# Patient Record
Sex: Female | Born: 1980 | Race: White | Hispanic: No | Marital: Married | Smoking: Current every day smoker
Health system: Southern US, Community
[De-identification: ages and names within clinical notes are randomized; demographics above are authoritative.]

## PROBLEM LIST (undated history)

## (undated) DIAGNOSIS — F1910 Other psychoactive substance abuse, uncomplicated: Secondary | ICD-10-CM

## (undated) DIAGNOSIS — K219 Gastro-esophageal reflux disease without esophagitis: Secondary | ICD-10-CM

## (undated) HISTORY — PX: HAND SURGERY: SHX662

---
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
## 2002-06-13 ENCOUNTER — Emergency Department (HOSPITAL_COMMUNITY): Admission: EM | Admit: 2002-06-13 | Discharge: 2002-06-13 | Payer: Self-pay | Admitting: Emergency Medicine

## 2006-01-27 ENCOUNTER — Encounter (INDEPENDENT_AMBULATORY_CARE_PROVIDER_SITE_OTHER): Payer: Self-pay | Admitting: Specialist

## 2006-01-27 ENCOUNTER — Ambulatory Visit: Payer: Self-pay | Admitting: Gynecology

## 2006-03-11 ENCOUNTER — Other Ambulatory Visit: Admission: RE | Admit: 2006-03-11 | Discharge: 2006-03-11 | Payer: Self-pay | Admitting: Obstetrics and Gynecology

## 2006-03-11 ENCOUNTER — Encounter (INDEPENDENT_AMBULATORY_CARE_PROVIDER_SITE_OTHER): Payer: Self-pay | Admitting: Specialist

## 2006-03-11 ENCOUNTER — Ambulatory Visit: Payer: Self-pay | Admitting: Gynecology

## 2006-03-24 ENCOUNTER — Ambulatory Visit: Payer: Self-pay | Admitting: Gynecology

## 2007-03-18 ENCOUNTER — Emergency Department (HOSPITAL_COMMUNITY): Admission: EM | Admit: 2007-03-18 | Discharge: 2007-03-18 | Payer: Self-pay | Admitting: Emergency Medicine

## 2007-08-14 ENCOUNTER — Emergency Department (HOSPITAL_COMMUNITY): Admission: EM | Admit: 2007-08-14 | Discharge: 2007-08-14 | Payer: Self-pay | Admitting: Emergency Medicine

## 2007-09-26 ENCOUNTER — Emergency Department (HOSPITAL_COMMUNITY): Admission: EM | Admit: 2007-09-26 | Discharge: 2007-09-26 | Payer: Self-pay | Admitting: Emergency Medicine

## 2008-01-10 ENCOUNTER — Inpatient Hospital Stay (HOSPITAL_COMMUNITY): Admission: AD | Admit: 2008-01-10 | Discharge: 2008-01-22 | Payer: Self-pay | Admitting: Obstetrics and Gynecology

## 2010-08-21 NOTE — Group Therapy Note (Signed)
NAME:  [name] [name], [name] [name]                  ACCOUNT NO.:  [id number]   MEDICAL RECORD NO.:  [id number]          PATIENT TYPE:  WOC   LOCATION:  [facility]                   FACILITY:  [facility]   PHYSICIAN:  Ginger Carne, MD DATE OF BIRTH:  [birth year]   DATE OF SERVICE:                                  CLINIC NOTE   The patient returns today for followup on colposcopy results.  Colposcopy performed for ASCUS on the 7 of December 2007 revealed no  abnormalities.  An ECC was performed, pathology revealing benign tissue.   RECOMMENDATIONS:  Given the patient's history, I recommended one-year  followup.  Patient understands need for followup and her diagnoses.           ______________________________  Ginger Carne, MD     SHB/MEDQ  D:  03/24/2006  T:  03/25/2006  Job:  [id number]

## 2010-08-21 NOTE — Group Therapy Note (Signed)
NAME:  [name], [name]                  ACCOUNT NO.:  [id number]   MEDICAL RECORD NO.:  [id number]          PATIENT TYPE:  WOC   LOCATION:  [facility]                   FACILITY:  [facility]   PHYSICIAN:  Ginger Carne, MD DATE OF BIRTH:  February 04, 1981   DATE OF SERVICE:                                    CLINIC NOTE   This patient is a 30 year old nully gravida Caucasian female followed by her  primary care physician for Pap smear and pelvic exam only.  The patient  apparently was told by her primary care doctor that she needed to have a Pap  smear and pelvic exam before he would initiate oral contraceptives.  The  patient has no specific complaints.   SALIENT PHYSICAL FINDINGS:  EXTERNAL GENITALIA:  Vulva and vagina normal.  Cervix smooth without erosions or lesions.  Pap smear performed.  Uterus  small, anteverted and flexed and both adnexa negative.   PLAN:  The patient will return to her primary care physician for continued  management of contraception and primary care.           ______________________________  Ginger Carne, MD     SHB/MEDQ  D:  01/27/2006  T:  01/28/2006  Job:  [id number]

## 2010-08-21 NOTE — Discharge Summary (Signed)
NAME:  [name] [name]                  ACCOUNT NO.:  [id number]   MEDICAL RECORD NO.:  [id number]          PATIENT TYPE:  INP   LOCATION:  9306                          [facility]   PHYSICIAN:  Freddy Finner, M.D.   DATE OF BIRTH:  [birth year]   DATE OF ADMISSION:  01/10/2008  DATE OF DISCHARGE:  01/22/2008                               DISCHARGE SUMMARY   ADMITTING DIAGNOSES:  1. Intrauterine pregnancy at 96 and 3/7th weeks' estimated gestational      age.  2. Preterm labor with advanced cervical dilatation.   DISCHARGE DIAGNOSES:  1. Status post spontaneous vaginal delivery.  2. Viable female infant.  3. Chorioamnionitis, resolving.   PROCEDURES:  Spontaneous vaginal delivery.   REASON FOR ADMISSION:  Please see written H and P.   HOSPITAL COURSE:  The patient is a 30 year old nulligravida that was  admitted to [facility] at 10 and 3/7th weeks' estimated  gestational age for evaluation and treatment of preterm labor.  The  patient had been seen in the office where she was noted to be 2-cm  dilated 50% effaced.  The patient was now sent to the hospital for  further evaluation and ultrasound, which had revealed a fetus of  estimated fetal weight of 1151 g and normal amniotic fluid index.  Bulging membranes were noted on ultrasound and cervix was noted to be  dilated 2.2 cm with no appreciable cervix.  No contractions were seen on  the monitor.  Vital signs were stable.  She was afebrile.  Fetal heart  tones were in the 140s with acceleration.  The patient was now admitted  for tocolysis of her labor, IV antibiotics and betamethasone.  On the  following morning, the patient was without complaint.  She denied any  contractions, vaginal bleeding, loss of fluid.  Baby was active.  Vital  signs were stable.  She was afebrile.  Lungs, clear to auscultation.  Heart, regular rate and rhythm.  The patient continued on magnesium  sulfate and group B beta strep culture  had been performed.  Unasyn was  now discontinued due to no evidence of preterm labor.  On the following  morning, the patient continued to deny vaginal bleeding or loss of  fluid, pain or contractions.  Baby was active.  Contractions revealed 2-  4 contractions throughout the previous day.  Cervical exam was deferred.  Decision was made to discontinue magnesium sulfate.  She has now  received betamethasone for 2 administrations and she was continued on  total bedrest.  The patient was now started on Procardia and she  continued on bedrest.  The following morning, vital signs continued to  remain stable.  She was afebrile.  Baby was active.  No appreciable  contractions were noted.  Foley was now discontinued.  Over the next  several days, the patient did undergo ultrasound evaluation.  Continued  on continuous monitors.  Baby was now noted to be in the footling breech  presentation and cervix was noted to be 4 cm in dilatation by  ultrasound.  The patient continued  on complete bedrest with Procardia  administration.  The patient did one morning desired to go out to smoke,  but due to advanced cervical dilatation with a breech presentation, the  decision was made to keep the patient on strict bed rest and nicotine  patch was applied.  On the following morning, the patient was now 30 and  5/7th weeks.  She had noted some bloody show the previous evening.  She  denied any contractions.  Baby continued to be active.  Cervix was found  to be 3.5 cm, 80% with vertex presentation.  The patient continued on  p.o. Procardia, Prometrium intravaginally.  On the following morning,  the patient did complain of some mild bleeding.  She denied any  contractions.  Vital signs remained stable.  Uterine monitor revealed no  detectable contractions; however, later in that afternoon, the patient  did experience some pressure and increase in bleeding.  Dr. Vincente Poli was  contacted at that point.  No contractions  were palpable or stain on the  uterine monitor.  Cervical exam revealed the cervix was now 8-9 cm  completely effaced with vertex presentation.  Ultrasound was performed  to confirm vertex presentation, and there was some concern for possible  abruption.  The patient was now sent to Labor and Delivery for delivery  and NICU was contacted for delivery.  After taking the patient to labor  and delivery area, artificial rupture of membranes was performed, which  revealed clear fluid.  The patient did feel warm and concern was that  the patient was developing chorioamnionitis.  Contractions were now  detectable on the tachometer reading approximately every 2 minutes.  Fetal scalp electrode was applied, but was not working well.  The  patient was given additional dose of Unasyn.  Discussion was made with  the patient and her mother and all questions were answered.  The patient  now progressed to complete dilatation with vertex at a +2 station.  She  pushed less than 5 times and subsequently delivered a viable female  infant over intact perineum with Apgars of 8 at one minute and 9 at five  minutes.  NICU was present at the delivery.  Placenta was delivered  intact with 3-vessel cord and was sent to pathology.  No lacerations  were detected and estimated blood loss was 300 mL.  On the following  morning, the patient was doing well.  Baby was in the NICU, stable.  Vital signs were stable.  She was afebrile.  Abdomen, soft.  Fundus,  firm, nontender.  Moderate amount of lochia was noted.  On postpartum  day 2, the patient was without complaint.  Baby continued to be stable  in the NICU.  She was afebrile.  Fundus was firm and nontender.  Perineum was intact.  The patient was later discharged home.   CONDITION ON DISCHARGE:  Stable.   DIET:  As tolerated.   ACTIVITY:  Up as desired.  Restriction of no vaginal entry tissues,  tampons, intercourse, or tub baths.   DISCHARGE MEDICATIONS:  1.  Prenatal vitamins 1 p.o. daily.  2. Darvocet-N 100 numbers 30 one p.o. every 4 hours as needed for      pain.      Julio Sicks, N.P.      Freddy Finner, M.D.     CC/MEDQ  D:  02/09/2008  T:  02/10/2008  Job:  [id number]

## 2010-09-29 ENCOUNTER — Emergency Department (HOSPITAL_COMMUNITY): Payer: Self-pay

## 2010-09-29 ENCOUNTER — Emergency Department (HOSPITAL_COMMUNITY)
Admission: EM | Admit: 2010-09-29 | Discharge: 2010-09-29 | Disposition: A | Discharge status: Home / Self Care | Payer: Self-pay | Attending: Emergency Medicine | Admitting: Emergency Medicine

## 2010-09-29 DIAGNOSIS — W208XXA Other cause of strike by thrown, projected or falling object, initial encounter: Secondary | ICD-10-CM | POA: Insufficient documentation

## 2010-09-29 DIAGNOSIS — M79609 Pain in unspecified limb: Secondary | ICD-10-CM | POA: Insufficient documentation

## 2010-09-29 DIAGNOSIS — M7989 Other specified soft tissue disorders: Secondary | ICD-10-CM | POA: Insufficient documentation

## 2010-09-29 DIAGNOSIS — S92309A Fracture of unspecified metatarsal bone(s), unspecified foot, initial encounter for closed fracture: Secondary | ICD-10-CM | POA: Insufficient documentation

## 2011-01-04 LAB — CBC
HCT: 31.9 — ABNORMAL LOW
HCT: 36.2
Hemoglobin: 12.2
MCHC: 33.6
MCHC: 33.7
MCV: 93.2
MCV: 93.9
Platelets: 214
RDW: 13.6
WBC: 18.5 — ABNORMAL HIGH

## 2011-01-04 LAB — STREP B DNA PROBE: Strep Group B Ag: NEGATIVE

## 2011-01-04 LAB — RPR: RPR Ser Ql: NONREACTIVE

## 2011-01-11 LAB — URINALYSIS, ROUTINE W REFLEX MICROSCOPIC
Hgb urine dipstick: NEGATIVE
Specific Gravity, Urine: 1.023
Urobilinogen, UA: 0.2
pH: 7

## 2011-01-11 LAB — POCT PREGNANCY, URINE: Preg Test, Ur: NEGATIVE

## 2011-08-19 ENCOUNTER — Encounter (HOSPITAL_COMMUNITY): Payer: Self-pay | Admitting: Emergency Medicine

## 2011-08-19 ENCOUNTER — Emergency Department (HOSPITAL_COMMUNITY)
Admission: EM | Admit: 2011-08-19 | Discharge: 2011-08-20 | Disposition: A | Discharge status: Home / Self Care | Payer: Self-pay | Attending: Orthopedic Surgery | Admitting: Orthopedic Surgery

## 2011-08-19 DIAGNOSIS — L03119 Cellulitis of unspecified part of limb: Secondary | ICD-10-CM | POA: Insufficient documentation

## 2011-08-19 DIAGNOSIS — L02519 Cutaneous abscess of unspecified hand: Secondary | ICD-10-CM | POA: Insufficient documentation

## 2011-08-19 DIAGNOSIS — M659 Unspecified synovitis and tenosynovitis, unspecified site: Secondary | ICD-10-CM

## 2011-08-19 NOTE — ED Notes (Signed)
PT. REPORTS RIGHT WRIST PAIN WITH SWELLING ONSET YESTERDAY , STATES HER CAT SCRATCH HER.

## 2011-08-20 ENCOUNTER — Emergency Department (HOSPITAL_COMMUNITY): Payer: Self-pay

## 2011-08-20 ENCOUNTER — Encounter (HOSPITAL_COMMUNITY)
Admission: EM | Disposition: A | Discharge status: Home / Self Care | Payer: Self-pay | Source: Home / Self Care | Attending: Emergency Medicine

## 2011-08-20 ENCOUNTER — Encounter (HOSPITAL_COMMUNITY): Payer: Self-pay | Admitting: Certified Registered"

## 2011-08-20 ENCOUNTER — Encounter (HOSPITAL_COMMUNITY): Payer: Self-pay | Admitting: Orthopedic Surgery

## 2011-08-20 ENCOUNTER — Emergency Department (HOSPITAL_COMMUNITY): Payer: Self-pay | Admitting: Certified Registered"

## 2011-08-20 HISTORY — PX: I & D EXTREMITY: SHX5045

## 2011-08-20 LAB — CBC
MCHC: 33.8 g/dL (ref 30.0–36.0)
MCV: 90.9 fL (ref 78.0–100.0)
RBC: 4.4 MIL/uL (ref 3.87–5.11)
RDW: 13.6 % (ref 11.5–15.5)

## 2011-08-20 LAB — DIFFERENTIAL
Basophils Relative: 0 % (ref 0–1)
Eosinophils Relative: 1 % (ref 0–5)
Lymphs Abs: 1.9 10*3/uL (ref 0.7–4.0)
Monocytes Absolute: 0.5 10*3/uL (ref 0.1–1.0)
Monocytes Relative: 4 % (ref 3–12)
Neutro Abs: 8.8 10*3/uL — ABNORMAL HIGH (ref 1.7–7.7)
Neutrophils Relative %: 78 % — ABNORMAL HIGH (ref 43–77)

## 2011-08-20 LAB — BASIC METABOLIC PANEL
CO2: 23 mEq/L (ref 19–32)
Calcium: 9.2 mg/dL (ref 8.4–10.5)
Creatinine, Ser: 0.8 mg/dL (ref 0.50–1.10)
GFR calc Af Amer: >90 mL/min (ref >90)
Potassium: 4.2 mEq/L (ref 3.5–5.1)
Sodium: 135 mEq/L (ref 135–145)

## 2011-08-20 SURGERY — IRRIGATION AND DEBRIDEMENT EXTREMITY
Anesthesia: General | Site: Wrist | Laterality: Right | Wound class: Dirty or Infected

## 2011-08-20 MED ORDER — MIDAZOLAM HCL 5 MG/5ML IJ SOLN
INTRAMUSCULAR | Status: DC | PRN
  Administered 2011-08-20: 2 mg via INTRAVENOUS

## 2011-08-20 MED ORDER — AMPICILLIN-SULBACTAM SODIUM 3 (2-1) G IJ SOLR
3.0000 g | Freq: Once | INTRAMUSCULAR | Status: AC
  Administered 2011-08-20: 3 g via INTRAVENOUS
  Filled 2011-08-20: qty 3

## 2011-08-20 MED ORDER — LACTATED RINGERS IV SOLN
INTRAVENOUS | Status: DC | PRN
  Administered 2011-08-20: 02:00:00 via INTRAVENOUS

## 2011-08-20 MED ORDER — HYDROMORPHONE HCL PF 1 MG/ML IJ SOLN: 0.2500 mg | INTRAMUSCULAR | Status: DC | PRN

## 2011-08-20 MED ORDER — OXYCODONE-ACETAMINOPHEN 5-325 MG PO TABS: ORAL_TABLET | ORAL | Status: AC

## 2011-08-20 MED ORDER — BUPIVACAINE HCL (PF) 0.25 % IJ SOLN
INTRAMUSCULAR | Status: DC | PRN
  Administered 2011-08-20: 10 mL

## 2011-08-20 MED ORDER — AMOXICILLIN-POT CLAVULANATE 875-125 MG PO TABS: 1.0000 | ORAL_TABLET | Freq: Two times a day (BID) | ORAL | Status: AC

## 2011-08-20 MED ORDER — ONDANSETRON HCL 4 MG/2ML IJ SOLN
4.0000 mg | Freq: Once | INTRAMUSCULAR | Status: AC
  Administered 2011-08-20: 4 mg via INTRAVENOUS
  Filled 2011-08-20: qty 2

## 2011-08-20 MED ORDER — LORAZEPAM 2 MG/ML IJ SOLN: 1.0000 mg | Freq: Once | INTRAMUSCULAR | Status: AC | PRN

## 2011-08-20 MED ORDER — MORPHINE SULFATE 4 MG/ML IJ SOLN
4.0000 mg | Freq: Once | INTRAMUSCULAR | Status: AC
  Administered 2011-08-20: 4 mg via INTRAVENOUS
  Filled 2011-08-20: qty 1

## 2011-08-20 MED ORDER — PROPOFOL 10 MG/ML IV EMUL
INTRAVENOUS | Status: DC | PRN
  Administered 2011-08-20: 160 mg via INTRAVENOUS

## 2011-08-20 MED ORDER — SODIUM CHLORIDE 0.9 % IR SOLN
Status: DC | PRN
  Administered 2011-08-20: 3000 mL

## 2011-08-20 MED ORDER — LIDOCAINE HCL (CARDIAC) 20 MG/ML IV SOLN
INTRAVENOUS | Status: DC | PRN
  Administered 2011-08-20: 100 mg via INTRAVENOUS

## 2011-08-20 MED ORDER — PROMETHAZINE HCL 25 MG/ML IJ SOLN: 6.2500 mg | INTRAMUSCULAR | Status: DC | PRN

## 2011-08-20 MED ORDER — FENTANYL CITRATE 0.05 MG/ML IJ SOLN
INTRAMUSCULAR | Status: DC | PRN
  Administered 2011-08-20: 100 ug via INTRAVENOUS

## 2011-08-20 SURGICAL SUPPLY — 53 items
BANDAGE COBAN STERILE 2 (GAUZE/BANDAGES/DRESSINGS) IMPLANT
BANDAGE CONFORM 2  STR LF (GAUZE/BANDAGES/DRESSINGS) IMPLANT
BANDAGE ELASTIC 3 VELCRO ST LF (GAUZE/BANDAGES/DRESSINGS) ×2 IMPLANT
BANDAGE ELASTIC 4 VELCRO ST LF (GAUZE/BANDAGES/DRESSINGS) ×1 IMPLANT
BANDAGE GAUZE ELAST BULKY 4 IN (GAUZE/BANDAGES/DRESSINGS) ×1 IMPLANT
BNDG CMPR 9X4 STRL LF SNTH (GAUZE/BANDAGES/DRESSINGS)
BNDG COHESIVE 1X5 TAN STRL LF (GAUZE/BANDAGES/DRESSINGS) IMPLANT
BNDG ESMARK 4X9 LF (GAUZE/BANDAGES/DRESSINGS) IMPLANT
CLOTH BEACON ORANGE TIMEOUT ST (SAFETY) ×2 IMPLANT
CORDS BIPOLAR (ELECTRODE) ×2 IMPLANT
COVER SURGICAL LIGHT HANDLE (MISCELLANEOUS) ×2 IMPLANT
DECANTER SPIKE VIAL GLASS SM (MISCELLANEOUS) ×2 IMPLANT
DRAIN PENROSE 1/4X12 LTX STRL (WOUND CARE) IMPLANT
DRSG ADAPTIC 3X8 NADH LF (GAUZE/BANDAGES/DRESSINGS) IMPLANT
DRSG EMULSION OIL 3X3 NADH (GAUZE/BANDAGES/DRESSINGS) ×1 IMPLANT
DRSG PAD ABDOMINAL 8X10 ST (GAUZE/BANDAGES/DRESSINGS) ×2 IMPLANT
GAUZE PACKING IODOFORM 1 (PACKING) ×1 IMPLANT
GAUZE XEROFORM 1X8 LF (GAUZE/BANDAGES/DRESSINGS) ×1 IMPLANT
GLOVE BIO SURGEON STRL SZ7.5 (GLOVE) ×2 IMPLANT
GLOVE BIOGEL PI IND STRL 8 (GLOVE) ×1 IMPLANT
GLOVE BIOGEL PI INDICATOR 8 (GLOVE) ×1
GOWN STRL REIN XL XLG (GOWN DISPOSABLE) ×1 IMPLANT
HANDPIECE INTERPULSE COAX TIP (DISPOSABLE)
KIT BASIN OR (CUSTOM PROCEDURE TRAY) ×2 IMPLANT
KIT ROOM TURNOVER OR (KITS) ×2 IMPLANT
LOOP VESSEL MAXI BLUE (MISCELLANEOUS) IMPLANT
LOOP VESSEL MINI RED (MISCELLANEOUS) IMPLANT
MANIFOLD NEPTUNE II (INSTRUMENTS) ×2 IMPLANT
NDL HYPO 25X1 1.5 SAFETY (NEEDLE) IMPLANT
NEEDLE HYPO 25X1 1.5 SAFETY (NEEDLE) ×2 IMPLANT
NS IRRIG 1000ML POUR BTL (IV SOLUTION) ×2 IMPLANT
PACK ORTHO EXTREMITY (CUSTOM PROCEDURE TRAY) ×2 IMPLANT
PAD ARMBOARD 7.5X6 YLW CONV (MISCELLANEOUS) ×4 IMPLANT
PADDING CAST ABS 4INX4YD NS (CAST SUPPLIES) ×1
PADDING CAST ABS COTTON 4X4 ST (CAST SUPPLIES) IMPLANT
SCRUB BETADINE 4OZ XXX (MISCELLANEOUS) ×2 IMPLANT
SET HNDPC FAN SPRY TIP SCT (DISPOSABLE) IMPLANT
SOLUTION BETADINE 4OZ (MISCELLANEOUS) ×2 IMPLANT
SPONGE GAUZE 4X4 12PLY (GAUZE/BANDAGES/DRESSINGS) ×1 IMPLANT
SPONGE LAP 18X18 X RAY DECT (DISPOSABLE) ×1 IMPLANT
SPONGE LAP 4X18 X RAY DECT (DISPOSABLE) ×1 IMPLANT
SUCTION FRAZIER TIP 10 FR DISP (SUCTIONS) ×2 IMPLANT
SUT ETHILON 4 0 PS 2 18 (SUTURE) ×2 IMPLANT
SUT MON AB 5-0 P3 18 (SUTURE) IMPLANT
SYR CONTROL 10ML LL (SYRINGE) ×1 IMPLANT
TOWEL OR 17X24 6PK STRL BLUE (TOWEL DISPOSABLE) ×2 IMPLANT
TOWEL OR 17X26 10 PK STRL BLUE (TOWEL DISPOSABLE) ×2 IMPLANT
TUBE ANAEROBIC SPECIMEN COL (MISCELLANEOUS) ×1 IMPLANT
TUBE CONNECTING 12X1/4 (SUCTIONS) ×2 IMPLANT
TUBE FEEDING 5FR 15 INCH (TUBING) IMPLANT
UNDERPAD 30X30 INCONTINENT (UNDERPADS AND DIAPERS) ×2 IMPLANT
WATER STERILE IRR 1000ML POUR (IV SOLUTION) ×2 IMPLANT
YANKAUER SUCT BULB TIP NO VENT (SUCTIONS) ×2 IMPLANT

## 2011-08-20 NOTE — ED Provider Notes (Signed)
History     CSN: [id number]  Arrival date & time 08/19/11  2343   First MD Initiated Contact with Patient 08/20/11 0025      Chief Complaint  Patient presents with  . Wrist Pain    (Consider location/radiation/quality/duration/timing/severity/associated sxs/prior treatment) HPI Comments: Patient here with right wrist pain s/p cat scratch 2 days ago - states began to notice increase in swelling earlier in the day today, states pain with movement of fingers and wrist - states intially just a hematoma to the dorsum of the hand with a couple of superficial scratches - last ate at 6:30pm - reports fever started this evening at 101 and she took ibuprofen for this - denies numbness or tingling distal to the area.  Patient is a 31 y.o. female presenting with wrist pain. The history is provided by the patient. No language interpreter was used.  Wrist Pain This is a new problem. The current episode started in the past 7 days. The problem occurs constantly. The problem has been unchanged. Associated symptoms include arthralgias, a fever, joint swelling and myalgias. Pertinent negatives include no abdominal pain, anorexia, change in bowel habit, chest pain, chills, congestion, coughing, diaphoresis, fatigue, headaches, nausea, neck pain, numbness, rash, sore throat, swollen glands, urinary symptoms, vertigo, visual change, vomiting or weakness. The symptoms are aggravated by nothing. She has tried nothing for the symptoms. The treatment provided no relief.    History reviewed. No pertinent past medical history.  History reviewed. No pertinent past surgical history.  No family history on file.  History  Substance Use Topics  . Smoking status: Not on file  . Smokeless tobacco: Not on file  . Alcohol Use: Not on file    OB History    Grav Para Term Preterm Abortions TAB SAB Ect Mult Living                  Review of Systems  Constitutional: Positive for fever. Negative for chills,  diaphoresis and fatigue.  HENT: Negative for congestion, sore throat and neck pain.   Respiratory: Negative for cough.   Cardiovascular: Negative for chest pain.  Gastrointestinal: Negative for nausea, vomiting, abdominal pain, anorexia and change in bowel habit.  Musculoskeletal: Positive for myalgias, joint swelling and arthralgias.  Skin: Negative for rash.  Neurological: Negative for vertigo, weakness, numbness and headaches.  All other systems reviewed and are negative.    Allergies  Review of patient's allergies indicates no known allergies.  Home Medications   Current Outpatient Rx  Name Route Sig Dispense Refill  . IBUPROFEN 200 MG PO TABS Oral Take 400 mg by mouth every 6 (six) hours as needed. For pain      BP 140/70  Pulse 97  Temp(Src) 98.5 F (36.9 C) (Oral)  Resp 16  SpO2 97%  Physical Exam  Nursing note and vitals reviewed. Constitutional: She is oriented to person, place, and time. She appears well-developed and well-nourished. No distress.  HENT:  Head: Normocephalic and atraumatic.  Right Ear: External ear normal.  Left Ear: External ear normal.  Nose: Nose normal.  Mouth/Throat: Oropharynx is clear and moist. No oropharyngeal exudate.  Eyes: Conjunctivae are normal. Pupils are equal, round, and reactive to light. No scleral icterus.  Neck: Normal range of motion. Neck supple.  Cardiovascular: Normal rate, regular rhythm and normal heart sounds.  Exam reveals no gallop and no friction rub.   No murmur heard. Pulmonary/Chest: Effort normal and breath sounds normal. No respiratory distress. She has no  wheezes. She has no rales. She exhibits no tenderness.  Abdominal: Soft. Bowel sounds are normal. She exhibits no distension. There is no tenderness.  Musculoskeletal:       Right hand: She exhibits decreased range of motion, tenderness and swelling. She exhibits no bony tenderness and normal capillary refill. normal sensation noted. Normal strength noted.        Hands:      Hematoma noted at the base of the 3rd and 4th MC with erythema and swelling more proximal to this - mild erythematous streaking up the forearm.  Lymphadenopathy:    She has no cervical adenopathy.  Neurological: She is alert and oriented to person, place, and time. No cranial nerve deficit.  Skin: Skin is warm and dry. No rash noted. There is erythema. No pallor.  Psychiatric: She has a normal mood and affect. Her behavior is normal. Judgment and thought content normal.    ED Course  Procedures (including critical care time)   Labs Reviewed  CBC  DIFFERENTIAL   No results found. Results for orders placed during the hospital encounter of 08/19/11  CBC      Component Value Range   WBC 11.2 (*) 4.0 - 10.5 (K/uL)   RBC 4.40  3.87 - 5.11 (MIL/uL)   Hemoglobin 13.5  12.0 - 15.0 (g/dL)   HCT 09.8  11.9 - 14.7 (%)   MCV 90.9  78.0 - 100.0 (fL)   MCH 30.7  26.0 - 34.0 (pg)   MCHC 33.8  30.0 - 36.0 (g/dL)   RDW 82.9  56.2 - 13.0 (%)   Platelets 171  150 - 400 (K/uL)  DIFFERENTIAL      Component Value Range   Neutrophils Relative 78 (*) 43 - 77 (%)   Neutro Abs 8.8 (*) 1.7 - 7.7 (K/uL)   Lymphocytes Relative 17  12 - 46 (%)   Lymphs Abs 1.9  0.7 - 4.0 (K/uL)   Monocytes Relative 4  3 - 12 (%)   Monocytes Absolute 0.5  0.1 - 1.0 (K/uL)   Eosinophils Relative 1  0 - 5 (%)   Eosinophils Absolute 0.1  0.0 - 0.7 (K/uL)   Basophils Relative 0  0 - 1 (%)   Basophils Absolute 0.0  0.0 - 0.1 (K/uL)   Dg Wrist Complete Right  08/20/2011  *RADIOLOGY REPORT*  Clinical Data: Right wrist pain and swelling.  Cat scratch.  RIGHT WRIST - COMPLETE 3+ VIEW  Comparison: None.  Findings: No fracture, foreign body, or acute bony findings are identified.  No gas is identified within the soft tissues.  IMPRESSION:  1.  No significant abnormality identified.  Original Report Authenticated By: Dellia Cloud, M.D.      Tenosynovitis    MDM  Patient with infection from cat  scratch c/w tenosynovitis - spoke with Dr. Merlyn Lot who will see the patient here and take the patient to the OR for I&D of the area.  Unasyn 3gm sent to the OR with the patient.        Izola Price [place], [place] 08/20/11 [phone]

## 2011-08-20 NOTE — ED Notes (Signed)
Pt getting undressed and ready for OR

## 2011-08-20 NOTE — Preoperative (Signed)
Beta Blockers   Reason not to administer Beta Blockers:Not Applicable 

## 2011-08-20 NOTE — Transfer of Care (Signed)
Immediate Anesthesia Transfer of Care Note  Patient: [name] [name]  Procedure(s) Performed: Procedure(s) (LRB): IRRIGATION AND DEBRIDEMENT EXTREMITY (Right)  Patient Location: PACU  Anesthesia Type: General  Level of Consciousness: awake, alert  and oriented  Airway & Oxygen Therapy: Patient Spontanous Breathing and Patient connected to nasal cannula oxygen  Post-op Assessment: Report given to PACU RN, Post -op Vital signs reviewed and stable and Patient moving all extremities  Post vital signs: Reviewed and stable  Complications: No apparent anesthesia complications

## 2011-08-20 NOTE — ED Provider Notes (Signed)
Medical screening examination/treatment/procedure(s) were performed by non-physician practitioner and as supervising physician I was immediately available for consultation/collaboration.  Arvie Bartholomew M Giordano Getman, MD 08/20/11 0649 

## 2011-08-20 NOTE — Op Note (Signed)
Dictation [phone]

## 2011-08-20 NOTE — Anesthesia Postprocedure Evaluation (Signed)
  Anesthesia Post-op Note  Patient: [name] [name]  Procedure(s) Performed: Procedure(s) (LRB): IRRIGATION AND DEBRIDEMENT EXTREMITY (Right)  Patient Location: PACU  Anesthesia Type: General  Level of Consciousness: awake  Airway and Oxygen Therapy: Patient Spontanous Breathing  Post-op Pain: mild  Post-op Assessment: Post-op Vital signs reviewed, Patient's Cardiovascular Status Stable, Respiratory Function Stable, Patent Airway, No signs of Nausea or vomiting and Pain level controlled  Post-op Vital Signs: stable  Complications: No apparent anesthesia complications

## 2011-08-20 NOTE — Discharge Instructions (Signed)

## 2011-08-20 NOTE — Op Note (Signed)
[name], [name]                ACCOUNT NO.:  [id number]  MEDICAL RECORD NO.:  [id number]  LOCATION:  [facility]                         FACILITY:  [facility]  PHYSICIAN:  Betha Loa, MD        DATE OF BIRTH:  02-18-1981  DATE OF PROCEDURE:  08/20/2011 DATE OF DISCHARGE:                              OPERATIVE REPORT   PREOPERATIVE DIAGNOSIS:  Right dorsal wrist abscess.  POSTOPERATIVE DIAGNOSIS:  Right dorsal wrist abscess and fourth dorsal compartment infection.  PROCEDURE:  Irrigation and debridement of right dorsal wrist abscess and fourth dorsal compartment.  SURGEON:  Betha Loa, M.D.  ASSISTANT:  None.  ANESTHESIA:  General.  IV FLUIDS:  Per anesthesia flow sheet.  ESTIMATED BLOOD LOSS:  Minimal.  COMPLICATIONS:  None.  SPECIMENS:  Cultures to Micro.  DISPOSITION:  Stable to PACU.  TOURNIQUET TIME:  30 minutes.  INDICATIONS:  [name] [name] is a 31 year old right-hand-dominant female who states a day and half ago, her kitten scratched her on the dorsum of the right hand.  She has progressively had more swelling, pain, and erythema in the dorsum of the hand and wrist.  She has pain with motion of her fingers.  She presented to [facility].  She was consulted for management of this problem.  On examination, she had intact sensation and capillary refill in all fingertips.  She can flex and extend the IP joint of the thumb across her fingers.  She had pain with motion of her fingers at the dorsal aspect of the wrist.  There was a fluctuant area here.  There was erythema surrounding it.  There were small scratch marks on the dorsum of the hand.  I recommended [name] [name] going to the operating room for irrigation and debridement of the abscess.  Risks, benefits, alternatives of surgery were discussed including the risk of blood loss; infection; damage to nerves, vessels, tendons, ligaments, bone; failure of surgery; need for additional surgery;  complications with wound healing, continued pain, continued infection; need for repeat irrigation and debridement.  She voiced understanding of these risks and elected to proceed.  OPERATIVE COURSE:  After being identified preoperatively by myself, the patient and I agreed upon the procedure and site of procedure.  The surgical site was marked.  The risks, benefits, and alternatives of surgery were reviewed and she wished to proceed.  Surgical consent had been signed.  Antibiotics were held for cultures.  She was transferred to the operating room and placed on the operating table in supine position with the right upper extremity on arm board.  General anesthesia was induced by the anesthesiologist.  The right upper extremity was prepped and draped in normal sterile orthopedic fashion. A surgical pause was performed between the surgeons, anesthesia, and operating room staff, and all were in agreement as to the patient, procedure, and site of procedure.  The tourniquet at the proximal aspect of the extremity was inflated to 250 mmHg after gravity exsanguination of the hand and wrist and Esmarch exsanguination of the forearm.  An incision was made directly over the dorsum of the wrist.  This carried into subcutaneous tissues by spreading technique.  There was  significant tenosynovium visible in the fourth dorsal compartment.  Once this was entered, there was significant amount of a thin cloudy fluid.  Cultures were taken for both aerobes and anaerobes, and sent to Microbiology for examination.  The wound was explored.  There was a significant amount of thickened and inflamed tenosynovium surrounding the EDC tendons.  This was distal to the actual compartment on the dorsum of the wrist.  The tenosynovium was debrided.  The joint capsule was visualized.  It was not bulging and did not appear full.  There was some dark friable tissue in the subcutaneous tissues at the distal aspect of the  wound.  This appeared that it may be a coagulated vein.  This was debrided as well. The wound was copiously irrigated with 3000 mL of sterile saline by cysto tubing.  The wound was then packed open with quarter-inch iodoform gauze.  A single 4-0 nylon suture was placed at the proximal aspect of the wound to decrease wound size.  The wound overall was left very open. It was injected with 10 mL of 0.25% plain Marcaine to aid in postoperative analgesia.  It was then dressed with sterile Xeroform, 4x4s, and wrapped with a Kerlix bandage.  A volar splint was placed and wrapped with Kerlix and Ace bandage.  Tourniquet was deflated at 30 minutes.  The operative drapes were broken down and the patient was awoken from anesthesia safely.  She was transferred back to stretcher and taken to PACU in stable condition.  I will give her Percocet 5/325, 1-2 p.o. q.6 hours p.r.n. pain, dispensed #40 and Augmentin 875 mg p.o. b.i.d. for 10 days.  She will be seen in the office on Monday to start hydrotherapy.     Betha Loa, MD     KK/MEDQ  D:  08/20/2011  T:  08/20/2011  Job:  [id number]

## 2011-08-20 NOTE — H&P (Signed)
[name] [name] is an 31 y.o. female.   Chief Complaint: right wrist pain HPI: 31 yo rhd female states she was scratched by her kitten yesterday.  Has developed pain, swelling, erythema, ecchymosis on dorsum of right hand/wrist.  Pain with motion of fingers.  Fever to 100 per patient.  Reports no other bite or scratch wounds at this time.  History reviewed. No pertinent past medical history.  History reviewed. No pertinent past surgical history.  No family history on file. Social History:  does not have a smoking history on file. She does not have any smokeless tobacco history on file. Her alcohol and drug histories not on file.  Allergies: No Known Allergies   (Not in a hospital admission)  Results for orders placed during the hospital encounter of 08/19/11 (from the past 48 hour(s))  CBC     Status: Abnormal   Collection Time   08/20/11 12:37 AM      Component Value Range Comment   WBC 11.2 (*) 4.0 - 10.5 (K/uL)    RBC 4.40  3.87 - 5.11 (MIL/uL)    Hemoglobin 13.5  12.0 - 15.0 (g/dL)    HCT 16.1  09.6 - 04.5 (%)    MCV 90.9  78.0 - 100.0 (fL)    MCH 30.7  26.0 - 34.0 (pg)    MCHC 33.8  30.0 - 36.0 (g/dL)    RDW 40.9  81.1 - 91.4 (%)    Platelets 171  150 - 400 (K/uL)   DIFFERENTIAL     Status: Abnormal   Collection Time   08/20/11 12:37 AM      Component Value Range Comment   Neutrophils Relative 78 (*) 43 - 77 (%)    Neutro Abs 8.8 (*) 1.7 - 7.7 (K/uL)    Lymphocytes Relative 17  12 - 46 (%)    Lymphs Abs 1.9  0.7 - 4.0 (K/uL)    Monocytes Relative 4  3 - 12 (%)    Monocytes Absolute 0.5  0.1 - 1.0 (K/uL)    Eosinophils Relative 1  0 - 5 (%)    Eosinophils Absolute 0.1  0.0 - 0.7 (K/uL)    Basophils Relative 0  0 - 1 (%)    Basophils Absolute 0.0  0.0 - 0.1 (K/uL)     Dg Wrist Complete Right  08/20/2011  *RADIOLOGY REPORT*  Clinical Data: Right wrist pain and swelling.  Cat scratch.  RIGHT WRIST - COMPLETE 3+ VIEW  Comparison: None.  Findings: No fracture, foreign  body, or acute bony findings are identified.  No gas is identified within the soft tissues.  IMPRESSION:  1.  No significant abnormality identified.  Original Report Authenticated By: Dellia Cloud, M.D.     A comprehensive review of systems was negative except for: Constitutional: positive for chills and fevers  Blood pressure 140/70, pulse 97, temperature 98.5 F (36.9 C), temperature source Oral, resp. rate 16, SpO2 97.00%.  General appearance: alert, cooperative and appears stated age Head: Normocephalic, without obvious abnormality, atraumatic Neck: supple, symmetrical, trachea midline Resp: clear to auscultation bilaterally Cardio: regular rate and rhythm GI: soft, non-tender; bowel sounds normal; no masses,  no organomegaly Extremities: light touch sensation and capillary refill intact all digits.  +epl/fpl/io.  left UE: no wounds or ttp.  right UE: scratch marks and pinpoint marks on dorsum of right hand.  ecchymosis dorsum of hand.  fluctuant area over wrist that is tender to palpation.  pain at this area with motion  of digits and wrist.  no tenderness at elbow or in digits.   no lymphangitis noted. Pulses: 2+ and symmetric Skin: as above Neurologic: Grossly normal Incision/Wound: As above  Assessment/Plan Right wrist/hand abscess.  Recommend OR for I&D of abscess.  Risks, benefits, and alternatives of surgery were discussed and the patient agrees with the plan of care.   Karon Cotterill R 08/20/2011, 1:31 AM

## 2011-08-20 NOTE — Anesthesia Preprocedure Evaluation (Addendum)
Anesthesia Evaluation  Patient identified by MRN, date of birth, ID band Patient awake    Reviewed: Allergy & Precautions, H&P , NPO status , Patient's Chart, lab work & pertinent test results  Airway Mallampati: I TM Distance: >3 FB Neck ROM: Full    Dental  (+) Teeth Intact   Pulmonary Current Smoker,    Pulmonary exam normal       Cardiovascular     Neuro/Psych    GI/Hepatic   Endo/Other    Renal/GU      Musculoskeletal   Abdominal   Peds  Hematology   Anesthesia Other Findings   Reproductive/Obstetrics                          Anesthesia Physical Anesthesia Plan  ASA: II and Emergent  Anesthesia Plan: General   Post-op Pain Management:    Induction: Intravenous  Airway Management Planned: LMA  Additional Equipment:   Intra-op Plan:   Post-operative Plan: Extubation in OR  Informed Consent: I have reviewed the patients History and Physical, chart, labs and discussed the procedure including the risks, benefits and alternatives for the proposed anesthesia with the patient or authorized representative who has indicated his/her understanding and acceptance.     Plan Discussed with: CRNA and Surgeon  Anesthesia Plan Comments:         Anesthesia Quick Evaluation

## 2011-08-20 NOTE — ED Notes (Signed)
Hand surgery at bedside.

## 2011-08-20 NOTE — Anesthesia Procedure Notes (Signed)
Procedure Name: LMA Insertion Date/Time: 08/20/2011 2:11 AM Performed by: Rossie Muskrat L Pre-anesthesia Checklist: Patient identified, Patient being monitored, Timeout performed, Emergency Drugs available and Suction available Patient Re-evaluated:Patient Re-evaluated prior to inductionPreoxygenation: Pre-oxygenation with 100% oxygen Intubation Type: IV induction LMA: LMA inserted LMA Size: 4.0 Number of attempts: 1 Placement Confirmation: positive ETCO2 and breath sounds checked- equal and bilateral Tube secured with: Tape Dental Injury: Teeth and Oropharynx as per pre-operative assessment

## 2011-08-23 ENCOUNTER — Encounter (HOSPITAL_COMMUNITY): Payer: Self-pay | Admitting: Orthopedic Surgery

## 2011-08-23 LAB — CULTURE, ROUTINE-ABSCESS: Culture: NO GROWTH

## 2011-08-25 LAB — ANAEROBIC CULTURE

## 2011-11-01 ENCOUNTER — Emergency Department (HOSPITAL_COMMUNITY)
Admission: EM | Admit: 2011-11-01 | Discharge: 2011-11-01 | Disposition: A | Discharge status: Home / Self Care | Payer: Self-pay | Attending: Emergency Medicine | Admitting: Emergency Medicine

## 2011-11-01 ENCOUNTER — Emergency Department (HOSPITAL_COMMUNITY): Payer: Self-pay

## 2011-11-01 ENCOUNTER — Encounter (HOSPITAL_COMMUNITY): Payer: Self-pay | Admitting: *Deleted

## 2011-11-01 DIAGNOSIS — M25539 Pain in unspecified wrist: Secondary | ICD-10-CM | POA: Insufficient documentation

## 2011-11-01 DIAGNOSIS — F17200 Nicotine dependence, unspecified, uncomplicated: Secondary | ICD-10-CM | POA: Insufficient documentation

## 2011-11-01 MED ORDER — NAPROXEN 500 MG PO TABS: 500.0000 mg | ORAL_TABLET | Freq: Two times a day (BID) | ORAL | Status: DC

## 2011-11-01 MED ORDER — HYDROCODONE-ACETAMINOPHEN 5-325 MG PO TABS: 1.0000 | ORAL_TABLET | Freq: Four times a day (QID) | ORAL | Status: DC | PRN

## 2011-11-01 MED ORDER — HYDROCODONE-ACETAMINOPHEN 5-325 MG PO TABS
1.0000 | ORAL_TABLET | Freq: Once | ORAL | Status: AC
  Administered 2011-11-01: 1 via ORAL
  Filled 2011-11-01: qty 1
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
## 2011-11-01 NOTE — Progress Notes (Signed)
Orthopedic Tech Progress Note Patient Details:  [name] [name] 28-Mar-1981 [id number]  Ortho Devices Type of Ortho Device: Velcro wrist splint Ortho Device/Splint Location: left UE Ortho Device/Splint Interventions: Application   Tyshan Enderle T 11/01/2011, 12:06 PM

## 2011-11-01 NOTE — ED Notes (Signed)
Pt reported to RN that she has a friend that has worked somewhere in the medical field and could give her something for her back pain. Pt was told that she had to come to the friends house to receive it. Pt received an injection of an unknown medication in left wrist. Pt reported that pain and inflammation started since then. MD notified

## 2011-11-01 NOTE — ED Notes (Signed)
Pt woke up with swelling to left hand/wrist, states pain goes from fingers to elbow. Pt states she is unable to wiggle pinky and ring finger. Pt with bruising to left hand, she states its from falling through glass a while back.

## 2011-11-03 ENCOUNTER — Emergency Department (HOSPITAL_BASED_OUTPATIENT_CLINIC_OR_DEPARTMENT_OTHER)
Admission: EM | Admit: 2011-11-03 | Discharge: 2011-11-03 | Disposition: A | Discharge status: Home / Self Care | Payer: Self-pay | Attending: Emergency Medicine | Admitting: Emergency Medicine

## 2011-11-03 ENCOUNTER — Encounter (HOSPITAL_BASED_OUTPATIENT_CLINIC_OR_DEPARTMENT_OTHER): Payer: Self-pay | Admitting: *Deleted

## 2011-11-03 DIAGNOSIS — L03119 Cellulitis of unspecified part of limb: Secondary | ICD-10-CM

## 2011-11-03 DIAGNOSIS — F17200 Nicotine dependence, unspecified, uncomplicated: Secondary | ICD-10-CM | POA: Insufficient documentation

## 2011-11-03 DIAGNOSIS — L02519 Cutaneous abscess of unspecified hand: Secondary | ICD-10-CM | POA: Insufficient documentation

## 2011-11-03 LAB — CBC WITH DIFFERENTIAL/PLATELET
Basophils Relative: 0 % (ref 0–1)
Eosinophils Absolute: 0.1 10*3/uL (ref 0.0–0.7)
MCH: 30.6 pg (ref 26.0–34.0)
MCHC: 34.1 g/dL (ref 30.0–36.0)
Monocytes Relative: 6 % (ref 3–12)
Neutrophils Relative %: 75 % (ref 43–77)
Platelets: 205 10*3/uL (ref 150–400)

## 2011-11-03 LAB — BASIC METABOLIC PANEL
BUN: 9 mg/dL (ref 6–23)
Calcium: 9.5 mg/dL (ref 8.4–10.5)
GFR calc Af Amer: >90 mL/min (ref >90)
GFR calc non Af Amer: >90 mL/min (ref >90)
Potassium: 3.9 mEq/L (ref 3.5–5.1)

## 2011-11-03 LAB — RAPID URINE DRUG SCREEN, HOSP PERFORMED: Benzodiazepines: NOT DETECTED

## 2011-11-03 MED ORDER — OXYCODONE-ACETAMINOPHEN 5-325 MG PO TABS: 2.0000 | ORAL_TABLET | ORAL | Status: DC | PRN

## 2011-11-03 MED ORDER — VANCOMYCIN HCL IN DEXTROSE 1-5 GM/200ML-% IV SOLN
1000.0000 mg | Freq: Once | INTRAVENOUS | Status: AC
  Administered 2011-11-03 – 2011-11-04 (×2): 1000 mg via INTRAVENOUS
  Filled 2011-11-03: qty 200

## 2011-11-03 MED ORDER — ONDANSETRON HCL 4 MG/2ML IJ SOLN
4.0000 mg | Freq: Once | INTRAMUSCULAR | Status: AC
  Administered 2011-11-03: 4 mg via INTRAVENOUS
  Filled 2011-11-03: qty 2

## 2011-11-03 MED ORDER — HYDROMORPHONE HCL PF 1 MG/ML IJ SOLN
1.0000 mg | Freq: Once | INTRAMUSCULAR | Status: AC
  Administered 2011-11-03: 1 mg via INTRAVENOUS
  Filled 2011-11-03: qty 1

## 2011-11-03 MED ORDER — SODIUM CHLORIDE 0.9 % IV SOLN
Freq: Once | INTRAVENOUS | Status: AC
  Administered 2011-11-03: 22:00:00 via INTRAVENOUS

## 2011-11-03 NOTE — ED Notes (Addendum)
Pt seen 7/29 at [facility] for same and received rx for vicodin and naproxen
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
## 2011-11-04 ENCOUNTER — Encounter (HOSPITAL_COMMUNITY): Payer: Self-pay | Admitting: *Deleted

## 2011-11-04 ENCOUNTER — Ambulatory Visit (HOSPITAL_COMMUNITY): Payer: Self-pay | Admitting: Anesthesiology

## 2011-11-04 ENCOUNTER — Ambulatory Visit (HOSPITAL_COMMUNITY)
Admission: RE | Admit: 2011-11-04 | Discharge: 2011-11-05 | Disposition: A | Discharge status: Home / Self Care | Payer: Self-pay | Source: Ambulatory Visit | Attending: Orthopedic Surgery | Admitting: Orthopedic Surgery

## 2011-11-04 ENCOUNTER — Encounter (HOSPITAL_COMMUNITY)
Admission: RE | Disposition: A | Discharge status: Home / Self Care | Payer: Self-pay | Source: Ambulatory Visit | Attending: Orthopedic Surgery

## 2011-11-04 ENCOUNTER — Encounter (HOSPITAL_COMMUNITY): Payer: Self-pay | Admitting: Anesthesiology

## 2011-11-04 DIAGNOSIS — L02519 Cutaneous abscess of unspecified hand: Secondary | ICD-10-CM | POA: Insufficient documentation

## 2011-11-04 DIAGNOSIS — F17200 Nicotine dependence, unspecified, uncomplicated: Secondary | ICD-10-CM | POA: Insufficient documentation

## 2011-11-04 DIAGNOSIS — K219 Gastro-esophageal reflux disease without esophagitis: Secondary | ICD-10-CM | POA: Insufficient documentation

## 2011-11-04 DIAGNOSIS — L02512 Cutaneous abscess of left hand: Secondary | ICD-10-CM

## 2011-11-04 HISTORY — DX: Gastro-esophageal reflux disease without esophagitis: K21.9

## 2011-11-04 HISTORY — PX: I & D EXTREMITY: SHX5045

## 2011-11-04 LAB — HCG, SERUM, QUALITATIVE: Preg, Serum: NEGATIVE

## 2011-11-04 LAB — SURGICAL PCR SCREEN
MRSA, PCR: NEGATIVE
Staphylococcus aureus: NEGATIVE

## 2011-11-04 SURGERY — IRRIGATION AND DEBRIDEMENT EXTREMITY
Anesthesia: General | Laterality: Left | Wound class: Contaminated

## 2011-11-04 MED ORDER — MORPHINE SULFATE 2 MG/ML IJ SOLN
1.0000 mg | INTRAMUSCULAR | Status: DC | PRN
  Administered 2011-11-04 – 2011-11-05 (×3): 1 mg via INTRAVENOUS
  Filled 2011-11-04 (×3): qty 1

## 2011-11-04 MED ORDER — MUPIROCIN 2 % EX OINT
TOPICAL_OINTMENT | Freq: Once | CUTANEOUS | Status: AC
  Administered 2011-11-04: 11:00:00 via NASAL
  Filled 2011-11-04: qty 22

## 2011-11-04 MED ORDER — HYDROMORPHONE HCL PF 1 MG/ML IJ SOLN
INTRAMUSCULAR | Status: AC
  Filled 2011-11-04: qty 1

## 2011-11-04 MED ORDER — MIDAZOLAM HCL 5 MG/5ML IJ SOLN
INTRAMUSCULAR | Status: DC | PRN
  Administered 2011-11-04: 2 mg via INTRAVENOUS

## 2011-11-04 MED ORDER — MIDAZOLAM HCL 2 MG/2ML IJ SOLN
INTRAMUSCULAR | Status: AC
  Filled 2011-11-04: qty 2

## 2011-11-04 MED ORDER — HYDROMORPHONE HCL PF 1 MG/ML IJ SOLN
0.2500 mg | INTRAMUSCULAR | Status: DC | PRN
  Administered 2011-11-04 (×4): 0.5 mg via INTRAVENOUS

## 2011-11-04 MED ORDER — ACETAMINOPHEN 10 MG/ML IV SOLN: 1000.0000 mg | Freq: Once | INTRAVENOUS | Status: DC | PRN

## 2011-11-04 MED ORDER — VITAMIN C 500 MG PO TABS
1000.0000 mg | ORAL_TABLET | Freq: Every day | ORAL | Status: DC
  Administered 2011-11-04: 1000 mg via ORAL
  Filled 2011-11-04 (×2): qty 2

## 2011-11-04 MED ORDER — ONDANSETRON HCL 4 MG PO TABS: 4.0000 mg | ORAL_TABLET | Freq: Four times a day (QID) | ORAL | Status: DC | PRN

## 2011-11-04 MED ORDER — LACTATED RINGERS IV SOLN
INTRAVENOUS | Status: DC | PRN
  Administered 2011-11-04 (×2): via INTRAVENOUS

## 2011-11-04 MED ORDER — VANCOMYCIN HCL 1000 MG IV SOLR
1500.0000 mg | Freq: Two times a day (BID) | INTRAVENOUS | Status: DC
  Administered 2011-11-04: 1500 mg via INTRAVENOUS
  Filled 2011-11-04 (×4): qty 1500

## 2011-11-04 MED ORDER — SULFAMETHOXAZOLE-TRIMETHOPRIM 800-160 MG PO TABS: 1.0000 | ORAL_TABLET | Freq: Two times a day (BID) | ORAL | Status: AC

## 2011-11-04 MED ORDER — OXYCODONE HCL 5 MG PO TABS
ORAL_TABLET | ORAL | Status: AC
  Filled 2011-11-04: qty 2

## 2011-11-04 MED ORDER — SODIUM CHLORIDE 0.9 % IR SOLN
Status: DC | PRN
  Administered 2011-11-04: 1

## 2011-11-04 MED ORDER — ONDANSETRON HCL 4 MG/2ML IJ SOLN: 4.0000 mg | Freq: Four times a day (QID) | INTRAMUSCULAR | Status: DC | PRN

## 2011-11-04 MED ORDER — ONDANSETRON HCL 4 MG/2ML IJ SOLN: 4.0000 mg | Freq: Once | INTRAMUSCULAR | Status: DC | PRN

## 2011-11-04 MED ORDER — VANCOMYCIN HCL IN DEXTROSE 1-5 GM/200ML-% IV SOLN
INTRAVENOUS | Status: AC
  Filled 2011-11-04: qty 200

## 2011-11-04 MED ORDER — MUPIROCIN 2 % EX OINT
TOPICAL_OINTMENT | CUTANEOUS | Status: AC
  Filled 2011-11-04: qty 22

## 2011-11-04 MED ORDER — VANCOMYCIN HCL IN DEXTROSE 1-5 GM/200ML-% IV SOLN: 1000.0000 mg | INTRAVENOUS | Status: DC

## 2011-11-04 MED ORDER — FENTANYL CITRATE 0.05 MG/ML IJ SOLN
50.0000 ug | INTRAMUSCULAR | Status: DC | PRN
  Administered 2011-11-04: 100 ug via INTRAVENOUS

## 2011-11-04 MED ORDER — ADULT MULTIVITAMIN W/MINERALS CH
1.0000 | ORAL_TABLET | Freq: Every day | ORAL | Status: DC
  Administered 2011-11-04: 1 via ORAL
  Filled 2011-11-04 (×2): qty 1

## 2011-11-04 MED ORDER — FENTANYL CITRATE 0.05 MG/ML IJ SOLN
INTRAMUSCULAR | Status: DC | PRN
  Administered 2011-11-04 (×2): 50 ug via INTRAVENOUS
  Administered 2011-11-04: 100 ug via INTRAVENOUS

## 2011-11-04 MED ORDER — LACTATED RINGERS IV SOLN
INTRAVENOUS | Status: DC
  Administered 2011-11-04: 16:00:00 via INTRAVENOUS

## 2011-11-04 MED ORDER — DIPHENHYDRAMINE HCL 25 MG PO CAPS
25.0000 mg | ORAL_CAPSULE | Freq: Four times a day (QID) | ORAL | Status: DC | PRN
  Administered 2011-11-04 – 2011-11-05 (×2): 50 mg via ORAL
  Filled 2011-11-04 (×2): qty 2

## 2011-11-04 MED ORDER — LIDOCAINE HCL 1 % IJ SOLN
INTRAMUSCULAR | Status: DC | PRN
  Administered 2011-11-04: 40 mg via INTRADERMAL

## 2011-11-04 MED ORDER — ONDANSETRON HCL 4 MG/2ML IJ SOLN
INTRAMUSCULAR | Status: DC | PRN
  Administered 2011-11-04: 4 mg via INTRAVENOUS

## 2011-11-04 MED ORDER — FENTANYL CITRATE 0.05 MG/ML IJ SOLN
INTRAMUSCULAR | Status: AC
  Filled 2011-11-04: qty 2

## 2011-11-04 MED ORDER — OXYCODONE-ACETAMINOPHEN 5-325 MG PO TABS
2.0000 | ORAL_TABLET | Freq: Once | ORAL | Status: AC
  Administered 2011-11-04: 2 via ORAL

## 2011-11-04 MED ORDER — MIDAZOLAM HCL 2 MG/2ML IJ SOLN
1.0000 mg | INTRAMUSCULAR | Status: DC | PRN
  Administered 2011-11-04: 2 mg via INTRAVENOUS

## 2011-11-04 MED ORDER — OXYCODONE HCL 5 MG PO TABS
5.0000 mg | ORAL_TABLET | ORAL | Status: DC | PRN
  Administered 2011-11-04 (×2): 10 mg via ORAL
  Filled 2011-11-04: qty 2

## 2011-11-04 MED ORDER — ALPRAZOLAM 0.5 MG PO TABS
0.5000 mg | ORAL_TABLET | Freq: Four times a day (QID) | ORAL | Status: DC | PRN
  Administered 2011-11-04 – 2011-11-05 (×2): 0.5 mg via ORAL
  Filled 2011-11-04 (×2): qty 1

## 2011-11-04 MED ORDER — METHOCARBAMOL 500 MG PO TABS
ORAL_TABLET | ORAL | Status: AC
  Filled 2011-11-04: qty 1

## 2011-11-04 MED ORDER — CHLORHEXIDINE GLUCONATE 4 % EX LIQD: 60.0000 mL | Freq: Once | CUTANEOUS | Status: DC

## 2011-11-04 MED ORDER — DOCUSATE SODIUM 100 MG PO CAPS: 100.0000 mg | ORAL_CAPSULE | Freq: Two times a day (BID) | ORAL | Status: AC

## 2011-11-04 MED ORDER — HYDROMORPHONE HCL 2 MG PO TABS: 2.0000 mg | ORAL_TABLET | ORAL | Status: AC | PRN

## 2011-11-04 MED ORDER — CHLORPROMAZINE HCL 25 MG PO TABS
25.0000 mg | ORAL_TABLET | Freq: Three times a day (TID) | ORAL | Status: DC | PRN
  Filled 2011-11-04: qty 1

## 2011-11-04 MED ORDER — HYDROCODONE-ACETAMINOPHEN 10-325 MG PO TABS
1.0000 | ORAL_TABLET | ORAL | Status: DC | PRN
  Administered 2011-11-04 – 2011-11-05 (×2): 2 via ORAL
  Filled 2011-11-04 (×2): qty 2

## 2011-11-04 MED ORDER — KCL IN DEXTROSE-NACL 20-5-0.45 MEQ/L-%-% IV SOLN
INTRAVENOUS | Status: DC
  Administered 2011-11-05: 07:00:00 via INTRAVENOUS
  Filled 2011-11-04 (×3): qty 1000

## 2011-11-04 MED ORDER — OXYCODONE-ACETAMINOPHEN 5-325 MG PO TABS
ORAL_TABLET | ORAL | Status: AC
  Administered 2011-11-04: 2 via ORAL
  Filled 2011-11-04: qty 2

## 2011-11-04 MED ORDER — METHOCARBAMOL 500 MG PO TABS
500.0000 mg | ORAL_TABLET | Freq: Four times a day (QID) | ORAL | Status: DC | PRN
  Administered 2011-11-04 (×2): 500 mg via ORAL
  Filled 2011-11-04: qty 1

## 2011-11-04 MED ORDER — ZOLPIDEM TARTRATE 5 MG PO TABS
5.0000 mg | ORAL_TABLET | Freq: Every evening | ORAL | Status: DC | PRN
  Administered 2011-11-05: 5 mg via ORAL
  Filled 2011-11-04: qty 1

## 2011-11-04 MED ORDER — METHOCARBAMOL 100 MG/ML IJ SOLN
500.0000 mg | Freq: Four times a day (QID) | INTRAVENOUS | Status: DC | PRN
  Filled 2011-11-04: qty 5

## 2011-11-04 MED ORDER — DOCUSATE SODIUM 100 MG PO CAPS
100.0000 mg | ORAL_CAPSULE | Freq: Two times a day (BID) | ORAL | Status: DC
  Administered 2011-11-04: 100 mg via ORAL
  Filled 2011-11-04 (×3): qty 1

## 2011-11-04 SURGICAL SUPPLY — 54 items
BANDAGE CONFORM 2  STR LF (GAUZE/BANDAGES/DRESSINGS) IMPLANT
BANDAGE ELASTIC 3 VELCRO ST LF (GAUZE/BANDAGES/DRESSINGS) ×2 IMPLANT
BANDAGE ELASTIC 4 VELCRO ST LF (GAUZE/BANDAGES/DRESSINGS) ×2 IMPLANT
BANDAGE GAUZE ELAST BULKY 4 IN (GAUZE/BANDAGES/DRESSINGS) ×2 IMPLANT
BNDG CMPR 9X4 STRL LF SNTH (GAUZE/BANDAGES/DRESSINGS) ×1
BNDG COHESIVE 1X5 TAN STRL LF (GAUZE/BANDAGES/DRESSINGS) IMPLANT
BNDG ESMARK 4X9 LF (GAUZE/BANDAGES/DRESSINGS) ×2 IMPLANT
CLOTH BEACON ORANGE TIMEOUT ST (SAFETY) ×2 IMPLANT
CORDS BIPOLAR (ELECTRODE) ×2 IMPLANT
COVER SURGICAL LIGHT HANDLE (MISCELLANEOUS) ×2 IMPLANT
CUFF TOURNIQUET SINGLE 18IN (TOURNIQUET CUFF) ×2 IMPLANT
CUFF TOURNIQUET SINGLE 24IN (TOURNIQUET CUFF) IMPLANT
DRAIN PENROSE 1/4X12 LTX STRL (WOUND CARE) IMPLANT
DRAPE SURG 17X23 STRL (DRAPES) ×2 IMPLANT
DRSG ADAPTIC 3X8 NADH LF (GAUZE/BANDAGES/DRESSINGS) ×2 IMPLANT
ELECT REM PT RETURN 9FT ADLT (ELECTROSURGICAL)
ELECTRODE REM PT RTRN 9FT ADLT (ELECTROSURGICAL) IMPLANT
GAUZE XEROFORM 1X8 LF (GAUZE/BANDAGES/DRESSINGS) ×2 IMPLANT
GAUZE XEROFORM 5X9 LF (GAUZE/BANDAGES/DRESSINGS) IMPLANT
GLOVE BIOGEL PI IND STRL 8.5 (GLOVE) ×1 IMPLANT
GLOVE BIOGEL PI INDICATOR 8.5 (GLOVE) ×1
GLOVE SURG ORTHO 8.0 STRL STRW (GLOVE) ×2 IMPLANT
GOWN PREVENTION PLUS XLARGE (GOWN DISPOSABLE) ×2 IMPLANT
GOWN STRL NON-REIN LRG LVL3 (GOWN DISPOSABLE) ×6 IMPLANT
HANDPIECE INTERPULSE COAX TIP (DISPOSABLE)
KIT BASIN OR (CUSTOM PROCEDURE TRAY) ×2 IMPLANT
KIT ROOM TURNOVER OR (KITS) ×2 IMPLANT
MANIFOLD NEPTUNE II (INSTRUMENTS) ×2 IMPLANT
NDL HYPO 25GX1X1/2 BEV (NEEDLE) IMPLANT
NEEDLE HYPO 25GX1X1/2 BEV (NEEDLE) IMPLANT
NS IRRIG 1000ML POUR BTL (IV SOLUTION) ×2 IMPLANT
PACK ORTHO EXTREMITY (CUSTOM PROCEDURE TRAY) ×2 IMPLANT
PAD ARMBOARD 7.5X6 YLW CONV (MISCELLANEOUS) ×4 IMPLANT
PAD CAST 4YDX4 CTTN HI CHSV (CAST SUPPLIES) ×1 IMPLANT
PADDING CAST COTTON 4X4 STRL (CAST SUPPLIES) ×2
PADDING CAST SYNTHETIC 3 NS LF (CAST SUPPLIES) ×1
PADDING CAST SYNTHETIC 3X4 NS (CAST SUPPLIES) IMPLANT
SET HNDPC FAN SPRY TIP SCT (DISPOSABLE) IMPLANT
SOAP 2 % CHG 4 OZ (WOUND CARE) ×2 IMPLANT
SPONGE GAUZE 4X4 12PLY (GAUZE/BANDAGES/DRESSINGS) ×2 IMPLANT
SPONGE LAP 18X18 X RAY DECT (DISPOSABLE) ×2 IMPLANT
SPONGE LAP 4X18 X RAY DECT (DISPOSABLE) ×2 IMPLANT
SUCTION FRAZIER TIP 10 FR DISP (SUCTIONS) ×2 IMPLANT
SUT ETHILON 4 0 PS 2 18 (SUTURE) IMPLANT
SUT ETHILON 5 0 P 3 18 (SUTURE) ×1
SUT NYLON ETHILON 5-0 P-3 1X18 (SUTURE) ×1 IMPLANT
SYR CONTROL 10ML LL (SYRINGE) IMPLANT
TOWEL OR 17X24 6PK STRL BLUE (TOWEL DISPOSABLE) ×2 IMPLANT
TOWEL OR 17X26 10 PK STRL BLUE (TOWEL DISPOSABLE) ×2 IMPLANT
TUBE ANAEROBIC SPECIMEN COL (MISCELLANEOUS) IMPLANT
TUBE CONNECTING 12X1/4 (SUCTIONS) ×2 IMPLANT
UNDERPAD 30X30 INCONTINENT (UNDERPADS AND DIAPERS) ×2 IMPLANT
WATER STERILE IRR 1000ML POUR (IV SOLUTION) ×2 IMPLANT
YANKAUER SUCT BULB TIP NO VENT (SUCTIONS) ×2 IMPLANT

## 2011-11-04 NOTE — Preoperative (Signed)
Beta Blockers   Reason not to administer Beta Blockers:Not Applicable 

## 2011-11-04 NOTE — Plan of Care (Signed)
Problem: Diagnosis - Type of Surgery Goal: General Surgical Patient Education Left hand IND Left hand IND

## 2011-11-04 NOTE — Progress Notes (Signed)
ANTIBIOTIC CONSULT NOTE - INITIAL  Pharmacy Consult for Vancomycin Indication: s/p left hand surg prophylaxis  No Known Allergies  Patient Measurements: Height: 5\' 5"  (165.1 cm) Weight: 183 lb 6.8 oz (83.2 kg) IBW/kg (Calculated) : 57   Vital Signs: Temp: 98.6 F (37 C) (08/01 1820) Temp src: Oral (08/01 1820) BP: 132/81 mmHg (08/01 1820) Pulse Rate: 79  (08/01 1820)    Labs:  [facility] 11/03/11 2147  WBC 10.7*  HGB 14.1  PLT 205  LABCREA --  CREATININE 0.70   Estimated Creatinine Clearance: 110.6 ml/min (by C-G formula based on Cr of 0.7). No results found for this basename: VANCOTROUGH:2,VANCOPEAK:2,VANCORANDOM:2,GENTTROUGH:2,GENTPEAK:2,GENTRANDOM:2,TOBRATROUGH:2,TOBRAPEAK:2,TOBRARND:2,AMIKACINPEAK:2,AMIKACINTROU:2,AMIKACIN:2, in the last 72 hours   Microbiology: Recent Results (from the past 720 hour(s))  SURGICAL PCR SCREEN     Status: Normal   Collection Time   11/04/11 10:50 AM      Component Value Range Status Comment   MRSA, PCR NEGATIVE  NEGATIVE Final    Staphylococcus aureus NEGATIVE  NEGATIVE Final     Medical History: Past Medical History  Diagnosis Date  . GERD (gastroesophageal reflux disease)      Assessment: Patient is 31 yo female s/p debridement lt hand abcess  Goal of Therapy:  Vancomycin trough level 10-15 mcg/ml  Plan:  Vancomycin 1500 mg iv q12h Levels as needed Lucille Passy 11/04/2011,6:40 PM
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
## 2011-11-04 NOTE — Progress Notes (Signed)
Dr. Filbert Schilder called and notified of patient's excruciating pain and her crying. He stated to call Dr. Orlan Leavens.  Dr. Orlan Leavens called and pain and crying reported to him.  He stated he would call the OR to see what time they could proceed with surgery.  OR called to see if they could tell me a time after they talked to Dr. Orlan Leavens,  They will be sending ASAP.  Spoke with holding area RN who will inform Dr. Diamantina Monks of patient's arrival so an IV and pain med can be administered ASAP.

## 2011-11-04 NOTE — Brief Op Note (Deleted)
11/04/2011  3:43 PM  PATIENT:  [name] [name]  31 y.o. female  PRE-OPERATIVE DIAGNOSIS:  l hand wound  POST-OPERATIVE DIAGNOSIS: left hand abscess  PROCEDURE:  Procedure(s) (LRB): IRRIGATION AND DEBRIDEMENT EXTREMITY (Left)  SURGEON:  Surgeon(s) and Role:    * Sharma Covert, MD - Primary  PHYSICIAN ASSISTANT:   ASSISTANTS: none   ANESTHESIA:   general  EBL:     BLOOD ADMINISTERED:none  DRAINS: none   LOCAL MEDICATIONS USED:  MARCAINE    and NONE  SPECIMEN:  No Specimen  DISPOSITION OF SPECIMEN:  N/A  COUNTS:  YES  TOURNIQUET:  * Missing tourniquet times found for documented tourniquets in log:  [id number] *  DICTATION: .Other Dictation: Dictation Number [id number]  PLAN OF CARE: Admit for overnight observation  PATIENT DISPOSITION:  PACU - hemodynamically stable.   Delay start of Pharmacological VTE agent (>24hrs) due to surgical blood loss or risk of bleeding: not applicable

## 2011-11-04 NOTE — Anesthesia Preprocedure Evaluation (Signed)
Anesthesia Evaluation  Patient identified by MRN, date of birth, ID band Patient awake    Reviewed: Allergy & Precautions, H&P , NPO status , Patient's Chart, lab work & pertinent test results  Airway Mallampati: II      Dental  (+) Teeth Intact and Dental Advisory Given   Pulmonary  breath sounds clear to auscultation        Cardiovascular Rhythm:Regular Rate:Tachycardia     Neuro/Psych    GI/Hepatic   Endo/Other    Renal/GU      Musculoskeletal   Abdominal   Peds  Hematology   Anesthesia Other Findings   Reproductive/Obstetrics                           Anesthesia Physical Anesthesia Plan  ASA: II  Anesthesia Plan: General   Post-op Pain Management:    Induction: Intravenous  Airway Management Planned: LMA  Additional Equipment:   Intra-op Plan:   Post-operative Plan:   Informed Consent: I have reviewed the patients History and Physical, chart, labs and discussed the procedure including the risks, benefits and alternatives for the proposed anesthesia with the patient or authorized representative who has indicated his/her understanding and acceptance.   Dental advisory given  Plan Discussed with: CRNA and Surgeon  Anesthesia Plan Comments: (Infection L. Hand Anxiety Smoker  Plan GA  Kipp Brood, MD)        Anesthesia Quick Evaluation

## 2011-11-04 NOTE — Progress Notes (Signed)
Dr. Katrinka Blazing called about patient's pain and asked if she could take Oxycodone per home meds, order entered.

## 2011-11-04 NOTE — Brief Op Note (Signed)
11/04/2011  3:43 PM  PATIENT:  [name] [name]  31 y.o. female  PRE-OPERATIVE DIAGNOSIS:  l hand wound  POST-OPERATIVE DIAGNOSIS:  Left hand abscess  PROCEDURE:  Procedure(s) (LRB): IRRIGATION AND DEBRIDEMENT EXTREMITY (Left) Left hand extensor tenosynovectomy  SURGEON:  Surgeon(s) and Role:    * Sharma Covert, MD - Primary  PHYSICIAN ASSISTANT: none  ASSISTANTS: none none  ANESTHESIA:   general  EBL:   minimal  BLOOD ADMINISTERED:none  DRAINS: none   LOCAL MEDICATIONS USED:  NONE  SPECIMEN:  No Specimen  DISPOSITION OF SPECIMEN:  N/A  COUNTS:  YES  TOURNIQUET:  * Missing tourniquet times found for documented tourniquets in log:  [id number] *  DICTATION: .[id number] PLAN OF CARE: Admit for overnight observation  PATIENT DISPOSITION:  PACU - hemodynamically stable.   Delay start of Pharmacological VTE agent (>24hrs) due to surgical blood loss or risk of bleeding:n/a

## 2011-11-04 NOTE — Anesthesia Postprocedure Evaluation (Signed)
  Anesthesia Post-op Note  Patient: [name] [name]  Procedure(s) Performed: Procedure(s) (LRB): IRRIGATION AND DEBRIDEMENT EXTREMITY (Left)  Patient Location: PACU  Anesthesia Type: General  Level of Consciousness: awake, alert  and oriented  Airway and Oxygen Therapy: Patient Spontanous Breathing and Patient connected to nasal cannula oxygen  Post-op Pain: mild  Post-op Assessment: Post-op Vital signs reviewed and Patient's Cardiovascular Status Stable  Post-op Vital Signs: stable  Complications: No apparent anesthesia complications

## 2011-11-04 NOTE — Transfer of Care (Signed)
Immediate Anesthesia Transfer of Care Note  Patient: [name] [name]  Procedure(s) Performed: Procedure(s) (LRB): IRRIGATION AND DEBRIDEMENT EXTREMITY (Left)  Patient Location: PACU  Anesthesia Type: General  Level of Consciousness: awake, alert , oriented and sedated  Airway & Oxygen Therapy: Patient Spontanous Breathing and Patient connected to nasal cannula oxygen  Post-op Assessment: Report given to PACU RN  Post vital signs: Reviewed and stable  Complications: No apparent anesthesia complications

## 2011-11-05 ENCOUNTER — Encounter (HOSPITAL_COMMUNITY): Payer: Self-pay | Admitting: Orthopedic Surgery

## 2011-11-05 NOTE — Discharge Summary (Signed)
Physician Discharge Summary  Patient ID: [name] [name] MRN: [id number] DOB/AGE: 1980/04/06 31 y.o.  Admit date: 11/04/2011 Discharge date: 11/05/2011  Admission Diagnoses: l hand wound Past Medical History  Diagnosis Date  . GERD (gastroesophageal reflux disease)     Discharge Diagnoses:  Left hand infection Injection injury to hand  Surgeries: Procedure(s): IRRIGATION AND DEBRIDEMENT EXTREMITY on 11/04/2011    Consultants:    Discharged Condition: Improved  Hospital Course: [name] [name] is an 31 y.o. female who was admitted 11/04/2011 with a chief complaint of No chief complaint on file. , and found to have a diagnosis of l hand wound.  They were brought to the operating room on 11/04/2011 and underwent Procedure(s): IRRIGATION AND DEBRIDEMENT EXTREMITY.    They were given perioperative antibiotics: Anti-infectives     Start     Dose/Rate Route Frequency Ordered Stop   11/04/11 2300   vancomycin (VANCOCIN) 1,500 mg in sodium chloride 0.9 % 500 mL IVPB        1,500 mg 250 mL/hr over 120 Minutes Intravenous Every 12 hours 11/04/11 1849     11/04/11 1426   vancomycin (VANCOCIN) IVPB 1000 mg/200 mL premix  Status:  Discontinued        1,000 mg 200 mL/hr over 60 Minutes Intravenous 60 min pre-op 11/04/11 1426 11/04/11 1817   11/04/11 0000  sulfamethoxazole-trimethoprim (SEPTRA DS) 800-160 MG per tablet       1 tablet Oral 2 times daily 11/04/11 1707 11/14/11 2359        .  They were given sequential compression devices, early ambulation, and Other (comment) ambulation for DVT prophylaxis.  Recent vital signs: Patient Vitals for the past 24 hrs:  BP Temp Temp src Pulse Resp SpO2 Height Weight  11/04/11 2020 131/77 mmHg 98.2 F (36.8 C) Oral 108  16  100 % - -  11/04/11 1820 132/81 mmHg 98.6 F (37 C) Oral 79  16  99 % - -  11/04/11 1746 131/78 mmHg 98.2 F (36.8 C) - 77  6  100 % - -  11/04/11 1731 130/90 mmHg - - 101  21  100 % - -  11/04/11 1716 145/84 mmHg - - 89  13   100 % - -  11/04/11 1645 137/118 mmHg 97.6 F (36.4 C) - 89  22  100 % - -  11/04/11 1550 - - - 86  20  100 % - -  11/04/11 1549 - - - 84  20  100 % - -  11/04/11 1541 - - - 92  20  99 % - -  11/04/11 1540 - - - 100  20  99 % - -  11/04/11 1055 - - - - - - 5\' 5"  (1.651 m) 83.2 kg (183 lb 6.8 oz)  11/04/11 1036 117/80 mmHg 97.7 F (36.5 C) Oral 87  20  98 % - -  .  Recent laboratory studies: No results found.  Discharge Medications:   Medication List  As of 11/05/2011  7:30 AM   STOP taking these medications         HYDROcodone-acetaminophen 5-325 MG per tablet      naproxen 500 MG tablet      oxyCODONE-acetaminophen 5-325 MG per tablet         TAKE these medications         docusate sodium 100 MG capsule   Commonly known as: COLACE   Take 1 capsule (100 mg total) by mouth 2 (two)  times daily.      HYDROmorphone 2 MG tablet   Commonly known as: DILAUDID   Take 1 tablet (2 mg total) by mouth every 4 (four) hours as needed for pain.      sulfamethoxazole-trimethoprim 800-160 MG per tablet   Commonly known as: BACTRIM DS,SEPTRA DS   Take 1 tablet by mouth 2 (two) times daily.            Diagnostic Studies: Dg Wrist Complete Right  11/01/2011  *RADIOLOGY REPORT*  Clinical Data: Hand pain/swelling  RIGHT WRIST - COMPLETE 3+ VIEW  Comparison: None.  Findings: No fracture or dislocation is seen.  The joint spaces are preserved.  The visualized soft tissues are unremarkable.  IMPRESSION: No fracture or dislocation is seen.  Original Report Authenticated By: Charline Bills, M.D.    They benefited maximally from their hospital stay and there were no complications.     Disposition: 01-Home or Self Care Discharge Orders    Future Orders Please Complete By Expires   Discharge patient        Follow-up Information    Follow up with Sharma Covert, MD. Schedule an appointment as soon as possible for a visit in 7 days.   Contact information:   [facility] [address] [place] [address] [phone]           Signed: Sharma Covert 11/05/2011, 7:30 AM

## 2011-11-05 NOTE — Progress Notes (Signed)
Utilization review complete 

## 2011-11-05 NOTE — Progress Notes (Signed)
Discharge instructions and prescriptions given to patient.  Patient verbalized understanding.  Patient stated she would like to make her own follow-up appointment.  Patient stated her clothing and jewelry were taken home by her mother.  All all belongings in the room will be taken home by her and her spouse who is as the bedside.  All questions answered.  Ingrid Shifrin N

## 2011-11-05 NOTE — Op Note (Signed)
[name], [name]                ACCOUNT NO.:  [id number]  MEDICAL RECORD NO.:  [id number]  LOCATION:  5N28C                        FACILITY:  [facility]  PHYSICIAN:  Madelynn Done, MD  DATE OF BIRTH:  12/07/1980  DATE OF PROCEDURE:  11/04/2011 DATE OF DISCHARGE:                              OPERATIVE REPORT   PREOPERATIVE DIAGNOSIS:  Left hand infection.  POSTOPERATIVE DIAGNOSIS:  Left hand infection.  ATTENDING PHYSICIAN:  Madelynn Done, MD who scrubbed and present for the entire procedure.  ASSISTANT SURGEON:  None.  ANESTHESIA:  General via LMA.  PROCEDURE: Left hand fourth and fifth extensor tenosynovectomy 2. Left hand drainage of hand abscess dorsal bursa and debridement  SURGICAL INDICATIONS:  [name] is a 31 year old female who injected unknown substance in the dorsal aspect of the left hand.  She presented to the emergency department with a worsening infection over the dorsal aspect of her hand.  The patient was consented to undergo the below procedure.  Risks, benefits, and alternatives were discussed in detail with the patient and signed informed consent was obtained.  Risks include, but not limited to bleeding, infection, damage to nearby nerves, arteries, or tendons, incomplete relief of pain, loss of motion, persistent infection, and need for further surgical intervention.  DESCRIPTION OF PROCEDURE:  The patient was appropriately identified in the preop holding area and mark with a permanent marker made on left hand to indicate correct operative site.  The patient then brought back to the operating room, placed supine on the anesthesia room table where general anesthesia was administered.  The patient tolerated this well. Well-padded tourniquet was then placed on the left brachium and sealed with 1000 drape.  Left upper extremity was then prepped and draped in normal sterile fashion.  Time-out was called, correct side was identified, and the procedure  was then begun.  Attention was then turned to the dorsal aspect of the hand where 2 long skin incisions were made directly over the dorsum of the hand between the 4th and 5th web space, index and long finger web space.  Dissection was then carried down where a large amount of subcutaneous fluid was then encountered and drainage of the area was then carried out.  The patient between the 4th and 5th web space had extensive amount of black substance all throughout the dorsal left of the hand, this was likely where the substance was injected and it was all along the extensor tendons.  Aggressive tenosynovectomy was then carried out of the 4th and 5th dorsal compartments removing the substance off the tendons.  After aggressive tenosynovectomy, the wound was then copiously irrigated with 3 L of saline solution running the irrigation throughout the hand.  After tenosynovectomy and drainage of the abscess area, and thorough irrigation, the wound was then loosely reapproximated with simple 3-0 nylon sutures.  Adaptic dressing and sterile compressive bandage was then applied.  The patient was then placed in a well-padded volar splint, extubated, and taken to recovery room in good condition.  POSTOPERATIVE PLAN:  The patient will be admitted overnight for IV antibiotics and pain control, discharged in the morning, seen back in the office next  week for repeat wound check and eval.     Madelynn Done, MD     FWO/MEDQ  D:  11/04/2011  T:  11/05/2011  Job:  [id number]

## 2011-11-06 LAB — WOUND CULTURE

## 2011-11-07 NOTE — ED Provider Notes (Signed)
Medical screening examination/treatment/procedure(s) were performed by non-physician practitioner and as supervising physician I was immediately available for consultation/collaboration.  Dasha Kawabata, MD 11/07/11 1500 

## 2011-11-08 LAB — TISSUE CULTURE: Gram Stain: NONE SEEN

## 2011-11-09 LAB — ANAEROBIC CULTURE

## 2011-11-16 ENCOUNTER — Encounter (HOSPITAL_COMMUNITY): Payer: Self-pay | Admitting: *Deleted

## 2011-11-16 DIAGNOSIS — K219 Gastro-esophageal reflux disease without esophagitis: Secondary | ICD-10-CM | POA: Insufficient documentation

## 2011-11-16 DIAGNOSIS — M25539 Pain in unspecified wrist: Secondary | ICD-10-CM | POA: Insufficient documentation

## 2011-11-16 DIAGNOSIS — Z4789 Encounter for other orthopedic aftercare: Secondary | ICD-10-CM | POA: Insufficient documentation

## 2011-11-16 DIAGNOSIS — F1910 Other psychoactive substance abuse, uncomplicated: Secondary | ICD-10-CM | POA: Insufficient documentation

## 2011-11-16 DIAGNOSIS — F17200 Nicotine dependence, unspecified, uncomplicated: Secondary | ICD-10-CM | POA: Insufficient documentation

## 2011-11-16 LAB — POCT I-STAT, CHEM 8
Calcium, Ion: 1.22 mmol/L (ref 1.12–1.23)
Hemoglobin: 14.3 g/dL (ref 12.0–15.0)
Sodium: 138 mEq/L (ref 135–145)
TCO2: 25 mmol/L (ref 0–100)

## 2011-11-16 LAB — CBC WITH DIFFERENTIAL/PLATELET
Basophils Absolute: 0 10*3/uL (ref 0.0–0.1)
Basophils Relative: 0 % (ref 0–1)
Eosinophils Absolute: 0.3 10*3/uL (ref 0.0–0.7)
Eosinophils Relative: 2 % (ref 0–5)
MCH: 31.1 pg (ref 26.0–34.0)
MCHC: 34.5 g/dL (ref 30.0–36.0)
MCV: 90.2 fL (ref 78.0–100.0)
Platelets: 290 10*3/uL (ref 150–400)
RDW: 13.4 % (ref 11.5–15.5)
WBC: 10.6 10*3/uL — ABNORMAL HIGH (ref 4.0–10.5)

## 2011-11-16 NOTE — ED Notes (Signed)
Pt had surgery on left arm 2 weeks ago and has cast on and feels like she has increased swelling.  Cap refill is good.  Harder to move last 2 fingers.  Pt states increased pain

## 2011-11-17 ENCOUNTER — Emergency Department (HOSPITAL_COMMUNITY)
Admission: EM | Admit: 2011-11-17 | Discharge: 2011-11-17 | Disposition: A | Discharge status: Home / Self Care | Payer: Self-pay | Attending: Emergency Medicine | Admitting: Emergency Medicine

## 2011-11-17 DIAGNOSIS — M25539 Pain in unspecified wrist: Secondary | ICD-10-CM

## 2011-11-17 MED ORDER — HYDROMORPHONE HCL 2 MG PO TABS
2.0000 mg | ORAL_TABLET | Freq: Once | ORAL | Status: AC
  Administered 2011-11-17: 2 mg via ORAL
  Filled 2011-11-17: qty 1

## 2011-11-17 NOTE — Progress Notes (Signed)
Orthopedic Tech Progress Note Patient Details:  [name] [name] 12-17-1980 [id number]  Casting Cast Location: removal of left fiberglass cast reapplie volar splint Cast Material: Fiberglass Cast Intervention: Removal     CammerMickie Bail 11/17/2011, 3:58 AM

## 2011-11-17 NOTE — ED Provider Notes (Signed)
History     CSN: [id number]  Arrival date & time 11/16/11  1830   First MD Initiated Contact with Patient 11/17/11 0124      Chief Complaint  Patient presents with  . Edema    arm swelling    (Consider location/radiation/quality/duration/timing/severity/associated sxs/prior treatment) HPI 31 year old female presents emergency part complaining of left wrist pain. Patient had surgery on the first after injecting an unknown brown substance into her veins and developing an infection. Patient reports since that time she has had pain in her left wrist. Patient was seen a week after the surgery in followup and had change from a splint to a cast. At that point time she reports she had some redness of the area that is hurting her now. She reports electrical type sensation going up her arm from this site. She has had no fevers no chills. She has not contacted her her surgeon for the symptoms. She has followup in 2 weeks with her surgeon.   Past Medical History  Diagnosis Date  . GERD (gastroesophageal reflux disease)     Past Surgical History  Procedure Date  . I&d extremity 08/20/2011    Procedure: IRRIGATION AND DEBRIDEMENT EXTREMITY;  Surgeon: Tami Ribas, MD;  Location: [facility] OR;  Service: Orthopedics;  Laterality: Right;  . Hand surgery     hand infection  . I&d extremity 11/04/2011    Procedure: IRRIGATION AND DEBRIDEMENT EXTREMITY;  Surgeon: Sharma Covert, MD;  Location: [facility] OR;  Service: Orthopedics;  Laterality: Left;  I & D ledft hand    No family history on file.  History  Substance Use Topics  . Smoking status: Current Everyday Smoker -- 1.0 packs/day for 13 years    Types: Cigarettes  . Smokeless tobacco: Never Used  . Alcohol Use: No    OB History    Grav Para Term Preterm Abortions TAB SAB Ect Mult Living                  Review of Systems  All other systems reviewed and are negative.    Allergies  Review of patient's allergies indicates no known  allergies.  Home Medications   Current Outpatient Rx  Name Route Sig Dispense Refill  . HYDROMORPHONE HCL 2 MG PO TABS Oral Take 2 mg by mouth every 4 (four) hours as needed. For pain    . SULFAMETHOXAZOLE-TRIMETHOPRIM 800-160 MG PO TABS Oral Take 1 tablet by mouth 2 (two) times daily. For 15 days, Start date unknown      BP 127/68  Pulse 73  Temp 98.7 F (37.1 C) (Oral)  Resp 18  SpO2 99%  LMP 10/25/2011  Physical Exam  Nursing note and vitals reviewed. Constitutional: She appears well-developed and well-nourished. No distress.  Cardiovascular: Normal rate, regular rhythm, normal heart sounds and intact distal pulses.  Exam reveals no gallop and no friction rub.   No murmur heard. Pulmonary/Chest: Effort normal and breath sounds normal. No respiratory distress. She has no wheezes. She has no rales. She exhibits no tenderness.  Musculoskeletal: Normal range of motion. She exhibits tenderness. She exhibits no edema.       Cast removed. Surgical incisions are clean dry and intact no signs of infection no drainage no induration. Patient with 2 cm area of erythema to left lateral dorsal wrist. This area is tender to palpation. No induration no fluctuance noted. the pain radiates up arm from this spot.  Skin: Skin is warm and dry. No rash noted.  She is not diaphoretic. There is erythema (erythema on left wrist as noted above). No pallor.    ED Course  Procedures (including critical care time)  Labs Reviewed  CBC WITH DIFFERENTIAL - Abnormal; Notable for the following:    WBC 10.6 (*)     All other components within normal limits  POCT I-STAT, CHEM 8   No results found.   1. Wrist pain       MDM  59-year-old female status post I&D of left hand due to tenosynovitis after IV drug use. Patient now with area of erythema to the left wrist, but no active signs of abscess or infection. This may be due to an early pressure sore from her cast. Patient reports symptoms are much improved  after removal of the cast although she is still tender in this area. I discussed the case with Dr. Amanda Pea on call for Dr. Melvyn Novas who recommended removal of cast. I will have her followup with him later today for recheck of this area. Patient was given oral Dilaudid here in the department. Patient requesting additional prescription of same, recently given Dilaudid after her surgery by her surgeon, will defer to the orthopedist for further refills of her pain medication        Olivia Mackie, MD 11/17/11 0800

## 2011-11-17 NOTE — ED Notes (Signed)
Ortho Tech at bedside.  

## 2011-11-17 NOTE — Progress Notes (Signed)
Orthopedic Tech Progress Note Patient Details:  [name] [name] Jan 22, 1981 [id number]  Ortho Devices Type of Ortho Device: Volar splint Ortho Device/Splint Interventions: Application   Cammer, Mickie Bail 11/17/2011, 3:57 AM

## 2011-11-17 NOTE — ED Notes (Signed)
Pt. States she got surgery on her left wrist x2 weeks ago to cut out an infection. Reports that the pain in her wrist prior to surgery has not resolved. States wrist feels swollen inside hard cast (place on the 8th) with burning pain radiating up her arm. Pulses strong, cap refill instant, patient denies numbness/tingling in fingers.

## 2011-12-03 LAB — FUNGUS CULTURE W SMEAR: Fungal Smear: NONE SEEN

## 2011-12-17 LAB — AFB CULTURE WITH SMEAR (NOT AT ARMC): Acid Fast Smear: NONE SEEN

## 2012-02-20 ENCOUNTER — Emergency Department (HOSPITAL_COMMUNITY)
Admission: EM | Admit: 2012-02-20 | Discharge: 2012-02-20 | Disposition: A | Discharge status: Home / Self Care | Payer: Self-pay | Attending: Emergency Medicine | Admitting: Emergency Medicine

## 2012-02-20 ENCOUNTER — Encounter (HOSPITAL_COMMUNITY): Payer: Self-pay | Admitting: Emergency Medicine

## 2012-02-20 DIAGNOSIS — L02519 Cutaneous abscess of unspecified hand: Secondary | ICD-10-CM | POA: Insufficient documentation

## 2012-02-20 DIAGNOSIS — Z8719 Personal history of other diseases of the digestive system: Secondary | ICD-10-CM | POA: Insufficient documentation

## 2012-02-20 LAB — CBC WITH DIFFERENTIAL/PLATELET
Basophils Absolute: 0 10*3/uL (ref 0.0–0.1)
Eosinophils Absolute: 0.2 10*3/uL (ref 0.0–0.7)
Lymphocytes Relative: 25 % (ref 12–46)
Lymphs Abs: 1.9 10*3/uL (ref 0.7–4.0)
Neutrophils Relative %: 67 % (ref 43–77)
Platelets: 249 10*3/uL (ref 150–400)
RBC: 4.66 MIL/uL (ref 3.87–5.11)
RDW: 13.8 % (ref 11.5–15.5)
WBC: 7.8 10*3/uL (ref 4.0–10.5)

## 2012-02-20 LAB — BASIC METABOLIC PANEL
CO2: 26 mEq/L (ref 19–32)
GFR calc non Af Amer: >90 mL/min (ref >90)
Glucose, Bld: 94 mg/dL (ref 70–99)
Potassium: 4.1 mEq/L (ref 3.5–5.1)
Sodium: 139 mEq/L (ref 135–145)

## 2012-02-20 MED ORDER — KETOROLAC TROMETHAMINE 60 MG/2ML IM SOLN: 60.0000 mg | Freq: Once | INTRAMUSCULAR | Status: DC

## 2012-02-20 MED ORDER — KETOROLAC TROMETHAMINE 30 MG/ML IJ SOLN
30.0000 mg | Freq: Once | INTRAMUSCULAR | Status: AC
  Administered 2012-02-20: 30 mg via INTRAVENOUS
  Filled 2012-02-20: qty 1

## 2012-02-20 MED ORDER — KETOROLAC TROMETHAMINE 30 MG/ML IJ SOLN: 30.0000 mg | Freq: Once | INTRAMUSCULAR | Status: DC

## 2012-02-20 MED ORDER — SODIUM CHLORIDE 0.9 % IV SOLN
3.0000 g | Freq: Once | INTRAVENOUS | Status: AC
  Administered 2012-02-20: 11:00:00 via INTRAVENOUS
  Filled 2012-02-20: qty 3

## 2012-02-20 MED ORDER — TRAMADOL HCL 50 MG PO TABS: 50.0000 mg | ORAL_TABLET | Freq: Four times a day (QID) | ORAL | Status: DC | PRN

## 2012-02-20 MED ORDER — ONDANSETRON 4 MG PO TBDP
4.0000 mg | ORAL_TABLET | Freq: Once | ORAL | Status: AC
  Administered 2012-02-20: 4 mg via ORAL
  Filled 2012-02-20: qty 1

## 2012-02-20 MED ORDER — SULFAMETHOXAZOLE-TRIMETHOPRIM 800-160 MG PO TABS: 1.0000 | ORAL_TABLET | Freq: Two times a day (BID) | ORAL | Status: DC

## 2012-02-20 NOTE — ED Notes (Signed)
Attempted IV times one without success. Patient has scar tissue to the back of both hands. She also has scar tissue in the right AC.

## 2012-02-20 NOTE — ED Notes (Signed)
Patient presents to the ED with complaints of pain in the right had, scar tissue present patient advises that this is from a surgery in April of this year she had an infection. She advised that for the last week she has had pain in the hand with redness and edema present.

## 2012-02-20 NOTE — ED Provider Notes (Signed)
History     CSN: [id number]  Arrival date & time 02/20/12  6213   First MD Initiated Contact with Patient 02/20/12 [phone]      Chief Complaint  Patient presents with  . Hand Problem    (Consider location/radiation/quality/duration/timing/severity/associated sxs/prior treatment) HPI  Pt to the ER with complaints of infection to right hand. She has a PMH of  Surgical I and D to right hand and left hand at different times in 2013. She has a PMH of IV drug use of unknown substances. She admits that she popped a boil on her hand a few days ago and then the redness started to spread very fast. It is very painful but pain does not worsen with ROM. No systemic symptoms of fevers, chills, n/v/d. nad vss.   Past Medical History  Diagnosis Date  . GERD (gastroesophageal reflux disease)     Past Surgical History  Procedure Date  . I&d extremity 08/20/2011    Procedure: IRRIGATION AND DEBRIDEMENT EXTREMITY;  Surgeon: Tami Ribas, MD;  Location: [facility] OR;  Service: Orthopedics;  Laterality: Right;  . Hand surgery     hand infection  . I&d extremity 11/04/2011    Procedure: IRRIGATION AND DEBRIDEMENT EXTREMITY;  Surgeon: Sharma Covert, MD;  Location: [facility] OR;  Service: Orthopedics;  Laterality: Left;  I & D ledft hand    History reviewed. No pertinent family history.  History  Substance Use Topics  . Smoking status: Current Every Day Smoker -- 1.0 packs/day for 13 years    Types: Cigarettes  . Smokeless tobacco: Never Used  . Alcohol Use: No    OB History    Grav Para Term Preterm Abortions TAB SAB Ect Mult Living                  Review of Systems  Review of Systems  Gen: no weight loss, fevers, chills, night sweats  Neck: no neck pain  Lungs:No wheezing, coughing or hemoptysis CV: no chest pain, palpitations, dependent edema or orthopnea  Abd: no abdominal pain, nausea, vomiting  GU: no dysuria or gross hematuria  MSK:  No abnormalities  Neuro: no headache, no focal  neurologic deficits  Skin: right hand infection Psyche: negative.   Allergies  Review of patient's allergies indicates no known allergies.  Home Medications   Current Outpatient Rx  Name  Route  Sig  Dispense  Refill  . SULFAMETHOXAZOLE-TRIMETHOPRIM 800-160 MG PO TABS   Oral   Take 1 tablet by mouth every 12 (twelve) hours.   20 tablet   0   . TRAMADOL HCL 50 MG PO TABS   Oral   Take 1 tablet (50 mg total) by mouth every 6 (six) hours as needed for pain.   15 tablet   0     BP 117/69  Pulse 85  Temp 98.2 F (36.8 C) (Oral)  Resp 18  SpO2 100%  LMP 02/06/2012  Physical Exam  Nursing note and vitals reviewed. Constitutional: She appears well-developed and well-nourished. No distress.  HENT:  Head: Normocephalic and atraumatic.  Eyes: Pupils are equal, round, and reactive to light.  Neck: Normal range of motion. Neck supple.  Cardiovascular: Normal rate and regular rhythm.   Pulmonary/Chest: Effort normal.  Abdominal: Soft.  Musculoskeletal:       Right hand: She exhibits tenderness and swelling. She exhibits normal range of motion, no bony tenderness, normal two-point discrimination, normal capillary refill, no deformity and no laceration. normal sensation noted. Normal  strength noted.       Hands:      Pt has full active and passive ROM of wrist and all five fingers without pain.  Neurological: She is alert.  Skin: Skin is warm and dry.    ED Course  Procedures (including critical care time)   Labs Reviewed  CBC WITH DIFFERENTIAL  BASIC METABOLIC PANEL   No results found.   1. Hand abscess       MDM  Pt given pain medication and Unasyn IV in ED.  I discussed case with Dr. Melvyn Novas. Pt symptoms do not suggest infection has spread to the tendon sheath as she has no pain with ROMS and no systemic symptoms. Short arm splint, PO Bactrim and pain medications given. Pt will see Dr. Melvyn Novas  In his office tomorrow evening.  Pt has been advised of the  symptoms that warrant their return to the ED. Patient has voiced understanding and has agreed to follow-up with the PCP or specialist.          Dorthula Matas, PA 02/20/12 1024

## 2012-02-21 NOTE — ED Provider Notes (Signed)
Medical screening examination/treatment/procedure(s) were performed by non-physician practitioner and as supervising physician I was immediately available for consultation/collaboration.   Carleene Cooper III, MD 02/21/12 [phone]

## 2012-03-04 ENCOUNTER — Emergency Department (HOSPITAL_COMMUNITY)
Admission: EM | Admit: 2012-03-04 | Discharge: 2012-03-04 | Disposition: A | Discharge status: Home / Self Care | Payer: Self-pay | Attending: Emergency Medicine | Admitting: Emergency Medicine

## 2012-03-04 ENCOUNTER — Encounter (HOSPITAL_COMMUNITY): Payer: Self-pay

## 2012-03-04 DIAGNOSIS — L02519 Cutaneous abscess of unspecified hand: Secondary | ICD-10-CM | POA: Insufficient documentation

## 2012-03-04 DIAGNOSIS — K219 Gastro-esophageal reflux disease without esophagitis: Secondary | ICD-10-CM | POA: Insufficient documentation

## 2012-03-04 DIAGNOSIS — F17200 Nicotine dependence, unspecified, uncomplicated: Secondary | ICD-10-CM | POA: Insufficient documentation

## 2012-03-04 DIAGNOSIS — L0291 Cutaneous abscess, unspecified: Secondary | ICD-10-CM

## 2012-03-04 DIAGNOSIS — Z79899 Other long term (current) drug therapy: Secondary | ICD-10-CM | POA: Insufficient documentation

## 2012-03-04 MED ORDER — LIDOCAINE-EPINEPHRINE 2 %-1:100000 IJ SOLN
20.0000 mL | Freq: Once | INTRAMUSCULAR | Status: DC
  Filled 2012-03-04: qty 20

## 2012-03-04 MED ORDER — OXYCODONE-ACETAMINOPHEN 5-325 MG PO TABS: 1.0000 | ORAL_TABLET | Freq: Four times a day (QID) | ORAL | Status: DC | PRN

## 2012-03-04 MED ORDER — OXYCODONE-ACETAMINOPHEN 5-325 MG PO TABS
1.0000 | ORAL_TABLET | Freq: Once | ORAL | Status: AC
  Administered 2012-03-04: 1 via ORAL
  Filled 2012-03-04: qty 1

## 2012-03-04 NOTE — ED Notes (Signed)
Abscess started about 2 weeks ago, no drainage, swollen on inside of right wrist.

## 2012-03-04 NOTE — ED Provider Notes (Signed)
History   This chart was scribed for [name] Chick, MD scribed by Magnus Sinning. The patient was seen in room TR06C/TR06C at 11:20   CSN: [id number]  Arrival date & time 03/04/12  1042  Chief Complaint  Patient presents with  . Abscess    (Consider location/radiation/quality/duration/timing/severity/associated sxs/prior treatment) HPI Comments: [name] [name] is a 31 y.o. female who presents to the Emergency Department complaining of a 6 x 4 cm abscess located on the dorsum of her right hand that she says began about two weeks ago. The patient has a hx of abscesses to both her hands and reports show the patient had a recent incision and drainage procedure performed 13 days ago. She says she was suppose to take prescribed bactrim to treat this previous abscess, but states she did not pick up the rx. The pt explains the previous abscess healed on its own, but soon after the current abscess began to appear. She is unsure if whether she's had a fever, but does note moderate pain to the abscess area. Note from her previous ED visit provides that the patient has PMH of IV drug use.   Patient is a 31 y.o. female presenting with abscess. The history is provided by the patient. No language interpreter was used.  Abscess  This is a new problem. The current episode started more than one week ago. The onset was gradual. The problem has been gradually worsening. The abscess is present on the right wrist. The abscess is characterized by redness and painfulness. It is unknown what she was exposed to. Recently, medical care has been given at this facility.    Past Medical History  Diagnosis Date  . GERD (gastroesophageal reflux disease)     Past Surgical History  Procedure Date  . I&d extremity 08/20/2011    Procedure: IRRIGATION AND DEBRIDEMENT EXTREMITY;  Surgeon: Tami Ribas, MD;  Location: [facility] OR;  Service: Orthopedics;  Laterality: Right;  . Hand surgery     hand infection  . I&d extremity  11/04/2011    Procedure: IRRIGATION AND DEBRIDEMENT EXTREMITY;  Surgeon: Sharma Covert, MD;  Location: [facility] OR;  Service: Orthopedics;  Laterality: Left;  I & D ledft hand    History reviewed. No pertinent family history.  History  Substance Use Topics  . Smoking status: Current Every Day Smoker -- 1.0 packs/day for 13 years    Types: Cigarettes  . Smokeless tobacco: Never Used  . Alcohol Use: No    Review of Systems  All other systems reviewed and are negative.    Allergies  Review of patient's allergies indicates no known allergies.  Home Medications   Current Outpatient Rx  Name  Route  Sig  Dispense  Refill  . OXYCODONE-ACETAMINOPHEN 5-325 MG PO TABS   Oral   Take 1-2 tablets by mouth every 6 (six) hours as needed for pain.   15 tablet   0   . SULFAMETHOXAZOLE-TRIMETHOPRIM 800-160 MG PO TABS   Oral   Take 1 tablet by mouth every 12 (twelve) hours.   20 tablet   0   . TRAMADOL HCL 50 MG PO TABS   Oral   Take 1 tablet (50 mg total) by mouth every 6 (six) hours as needed for pain.   15 tablet   0     BP 112/72  Pulse 72  Temp 98.2 F (36.8 C) (Oral)  SpO2 98%  LMP 02/06/2012  Physical Exam  Nursing note and vitals reviewed. Constitutional:  She is oriented to person, place, and time. She appears well-developed and well-nourished. No distress.  HENT:  Head: Normocephalic and atraumatic.  Eyes: Conjunctivae normal and EOM are normal.  Neck: Neck supple. No tracheal deviation present.  Cardiovascular: Normal rate.   Pulmonary/Chest: Effort normal. No respiratory distress.  Abdominal: She exhibits no distension.  Musculoskeletal: Normal range of motion.       Abscess on the dorsum of the right wrist that is 6 cm x 4 cm. Erythematous and fluctuant with no active drainage. Full ROM of the flexor and extensor tendons of her hand and fingers. 2+ radial pulse and brisk cap refill distally  Neurological: She is alert and oriented to person, place, and time. No  sensory deficit.  Skin: Skin is warm and dry.  Psychiatric: She has a normal mood and affect. Her behavior is normal.    ED Course  INCISION AND DRAINAGE Date/Time: 03/04/2012 11:54 AM Performed by: [name] [name] Authorized by: Jerelyn Scott K Consent: Verbal consent obtained. Risks and benefits: risks, benefits and alternatives were discussed Consent given by: patient Patient understanding: patient states understanding of the procedure being performed Patient consent: the patient's understanding of the procedure matches consent given Patient identity confirmed: verbally with patient and arm band Time out: Immediately prior to procedure a "time out" was called to verify the correct patient, procedure, equipment, support staff and site/side marked as required. Type: abscess Body area: upper extremity Location details: right wrist Anesthesia: local infiltration Local anesthetic: lidocaine 2% with epinephrine Anesthetic total: 4 ml Patient sedated: no Scalpel size: 11 Incision type: single straight Complexity: complex Drainage: purulent Drainage amount: copious Packing material: 1/4 in iodoform gauze Patient tolerance: Patient tolerated the procedure well with no immediate complications.   (including critical care time) DIAGNOSTIC STUDIES: Oxygen Saturation is 98% on room air, normal by my interpretation.    COORDINATION OF CARE: 11:22: Physical exam performed.    Labs Reviewed - No data to display No results found.   1. Abscess       MDM  Pt presenting with abscess to dorsum of right wrist.  No pain with ROM of flexor /extensor tendons of thumb and fingers.  There is overlying cellulitis.  Abscess drained with copious amount of pus released.  Pt has rx for bactrim that she states is filled for her at [facility] for $4 and that she cannot afford it.  I have discussed with patient the importance of picking up that prescription due to the cellulitis.  She does endorse  smoking 2 packs of cigarettes per day and I have encouraged her to at least use some of that money to pay for her antibiotics.  Pt to f/u in 48 hours for wound recheck.  Discharged with strict return precautions.  Pt agreeable with plan.   I personally performed the services described in this documentation, which was scribed in my presence. The recorded information has been reviewed and is accurate.         [name] Chick, MD 03/04/12 [phone]

## 2012-03-04 NOTE — Progress Notes (Signed)
Orthopedic Tech Progress Note Patient Details:  [name] [name] 28-Nov-1980 [id number]  Ortho Devices Type of Ortho Device: Velcro wrist splint Ortho Device/Splint Location: RIGHT WRIST SPLINT Ortho Device/Splint Interventions: Application   Cammer, Mickie Bail 03/04/2012, 12:10 PM

## 2012-03-04 NOTE — ED Notes (Signed)
Ortho Tech paged awaiting return call 

## 2012-03-04 NOTE — ED Notes (Signed)
Ortho Tech returned call and will see pt in the ED

## 2012-03-04 NOTE — ED Notes (Signed)
Patient discharged using teach back method. Review instructions and patient verbalized an understanding

## 2012-03-17 ENCOUNTER — Encounter (HOSPITAL_BASED_OUTPATIENT_CLINIC_OR_DEPARTMENT_OTHER): Payer: Self-pay

## 2012-03-17 ENCOUNTER — Emergency Department (HOSPITAL_BASED_OUTPATIENT_CLINIC_OR_DEPARTMENT_OTHER)
Admission: EM | Admit: 2012-03-17 | Discharge: 2012-03-17 | Disposition: A | Discharge status: Home / Self Care | Payer: No Typology Code available for payment source | Attending: Emergency Medicine | Admitting: Emergency Medicine

## 2012-03-17 ENCOUNTER — Emergency Department (HOSPITAL_BASED_OUTPATIENT_CLINIC_OR_DEPARTMENT_OTHER): Payer: No Typology Code available for payment source

## 2012-03-17 DIAGNOSIS — S8000XA Contusion of unspecified knee, initial encounter: Secondary | ICD-10-CM | POA: Insufficient documentation

## 2012-03-17 DIAGNOSIS — F17200 Nicotine dependence, unspecified, uncomplicated: Secondary | ICD-10-CM | POA: Insufficient documentation

## 2012-03-17 DIAGNOSIS — Z8719 Personal history of other diseases of the digestive system: Secondary | ICD-10-CM | POA: Insufficient documentation

## 2012-03-17 DIAGNOSIS — S8001XA Contusion of right knee, initial encounter: Secondary | ICD-10-CM

## 2012-03-17 DIAGNOSIS — Y92410 Unspecified street and highway as the place of occurrence of the external cause: Secondary | ICD-10-CM | POA: Insufficient documentation

## 2012-03-17 DIAGNOSIS — Y9389 Activity, other specified: Secondary | ICD-10-CM | POA: Insufficient documentation

## 2012-03-17 MED ORDER — IBUPROFEN 800 MG PO TABS
ORAL_TABLET | ORAL | Status: AC
  Filled 2012-03-17: qty 1

## 2012-03-17 MED ORDER — IBUPROFEN 800 MG PO TABS
800.0000 mg | ORAL_TABLET | Freq: Once | ORAL | Status: AC
  Administered 2012-03-17: 800 mg via ORAL

## 2012-03-17 MED ORDER — IBUPROFEN 800 MG PO TABS: 800.0000 mg | ORAL_TABLET | Freq: Three times a day (TID) | ORAL | Status: DC

## 2012-03-17 NOTE — ED Notes (Signed)
Pt undressed and placed in gown , iv track marks noted to bil wrist, pt is drowsy alert and oriented , MD made aware of pts condition

## 2012-03-17 NOTE — ED Notes (Signed)
PA at bedside.
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
## 2012-03-17 NOTE — ED Notes (Signed)
Patient transported to X-ray 
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
## 2012-03-17 NOTE — ED Notes (Signed)
Pt walking around dept visiting other pts in dept. Pt cursing and verbally abusive to staff. Security called and pt escorted to lobby to await cab.

## 2012-03-19 NOTE — ED Provider Notes (Signed)
Medical screening examination/treatment/procedure(s) were performed by non-physician practitioner and as supervising physician I was immediately available for consultation/collaboration.   Gwyneth Sprout, MD 03/19/12 2130

## 2012-06-06 ENCOUNTER — Emergency Department (HOSPITAL_COMMUNITY)
Admission: EM | Admit: 2012-06-06 | Discharge: 2012-06-06 | Disposition: A | Discharge status: Home / Self Care | Payer: Self-pay | Attending: Emergency Medicine | Admitting: Emergency Medicine

## 2012-06-06 ENCOUNTER — Encounter (HOSPITAL_COMMUNITY): Payer: Self-pay | Admitting: *Deleted

## 2012-06-06 DIAGNOSIS — F17200 Nicotine dependence, unspecified, uncomplicated: Secondary | ICD-10-CM | POA: Insufficient documentation

## 2012-06-06 DIAGNOSIS — M25571 Pain in right ankle and joints of right foot: Secondary | ICD-10-CM

## 2012-06-06 DIAGNOSIS — M25579 Pain in unspecified ankle and joints of unspecified foot: Secondary | ICD-10-CM | POA: Insufficient documentation

## 2012-06-06 DIAGNOSIS — K089 Disorder of teeth and supporting structures, unspecified: Secondary | ICD-10-CM | POA: Insufficient documentation

## 2012-06-06 DIAGNOSIS — Z8719 Personal history of other diseases of the digestive system: Secondary | ICD-10-CM | POA: Insufficient documentation

## 2012-06-06 DIAGNOSIS — M2540 Effusion, unspecified joint: Secondary | ICD-10-CM | POA: Insufficient documentation

## 2012-06-06 DIAGNOSIS — K0889 Other specified disorders of teeth and supporting structures: Secondary | ICD-10-CM

## 2012-06-06 MED ORDER — OXYCODONE-ACETAMINOPHEN 5-325 MG PO TABS: 1.0000 | ORAL_TABLET | Freq: Four times a day (QID) | ORAL | Status: DC | PRN

## 2012-06-06 NOTE — ED Notes (Signed)
Pt with R lower molar pain r/t dental pain r/t breakdown.  Pt also c/o bil leg swelling, L greater than R, that began 1 week prior and has decreased since then. Pt c/o pain to medial asp of ankles bil.  Denies sob on exertion/laying down.  Pt did start a new job where she is constantly on her feet.

## 2012-06-06 NOTE — ED Notes (Signed)
Genella Bas at bedside

## 2012-06-06 NOTE — ED Provider Notes (Signed)
History  This chart was scribed for non-physician practitioner working with [name], MD, by Candelaria Stagers, ED Scribe. This patient was seen in room TR09C/TR09C and the patient's care was started at 4:26 PM   CSN: [id number]  Arrival date & time 06/06/12  1516   First MD Initiated Contact with Patient 06/06/12 1556      Chief Complaint  Patient presents with  . Dental Pain  . Joint Swelling     The history is provided by the patient. No language interpreter was used.   [name] [name] is a 32 y.o. female who presents to the Emergency Department complaining of intermittent right lower dental pain that started three days ago and has gradually worsened.  She denies fever, chills, or trouble swallowing.  Pt has taken ibuprofen with temporary relief.  Pt reports she has trouble to this tooth previously.  Pt is also complaining of bilateral ankle pain and swelling, left greater than right, that started one week ago.  Pt reports that over the last week the swelling has decreased.  She has never experienced these sx before.  Pt denies redness to the area.  She ambulates with pain.  Pt denies chest pain or SOB.  Pt denies h/o heart problems or lung problems.  Nothing seems to make the sx better or worse.  Pt reports that she recently started a new job about 2 weeks ago and is constantly on her feet. Swelling began shortly after beginning job.       Past Medical History  Diagnosis Date  . GERD (gastroesophageal reflux disease)     Past Surgical History  Procedure Laterality Date  . I&d extremity  08/20/2011    Procedure: IRRIGATION AND DEBRIDEMENT EXTREMITY;  Surgeon: Tami Ribas, MD;  Location: [facility] OR;  Service: Orthopedics;  Laterality: Right;  . Hand surgery      hand infection  . I&d extremity  11/04/2011    Procedure: IRRIGATION AND DEBRIDEMENT EXTREMITY;  Surgeon: Sharma Covert, MD;  Location: [facility] OR;  Service: Orthopedics;  Laterality: Left;  I & D ledft hand    No family history  on file.  History  Substance Use Topics  . Smoking status: Current Every Day Smoker -- 1.00 packs/day for 13 years    Types: Cigarettes  . Smokeless tobacco: Never Used  . Alcohol Use: No    OB History   Grav Para Term Preterm Abortions TAB SAB Ect Mult Living                  Review of Systems  Constitutional: Negative for fever and chills.  HENT: Positive for dental problem (right lower dental pain). Negative for trouble swallowing.   Respiratory: Negative for shortness of breath.   Cardiovascular: Negative for chest pain.  Musculoskeletal: Positive for joint swelling (bilateral ankle swelling) and arthralgias (bilateral ankle pain).  All other systems reviewed and are negative.    Allergies  Review of patient's allergies indicates no known allergies.  Home Medications   Current Outpatient Rx  Name  Route  Sig  Dispense  Refill  . ibuprofen (ADVIL,MOTRIN) 200 MG tablet   Oral   Take 200-400 mg by mouth every 6 (six) hours as needed for pain.           BP 125/78  Pulse 83  Temp(Src) 98 F (36.7 C) (Oral)  Resp 18  SpO2 100%  LMP 05/23/2012  Physical Exam  Nursing note and vitals reviewed. Constitutional: She is  oriented to person, place, and time. She appears well-developed and well-nourished. No distress.  HENT:  Head: Normocephalic and atraumatic.  Mouth/Throat: Uvula is midline, oropharynx is clear and moist and mucous membranes are normal.  Uvula midline.  Decaying right lower molar with obvious cavity.  No erythema.  No facial edema.    Eyes: Conjunctivae and EOM are normal.  Neck: Normal range of motion. Neck supple. No tracheal deviation present.  Cardiovascular: Normal rate, regular rhythm, normal heart sounds and intact distal pulses.   Pulmonary/Chest: Effort normal and breath sounds normal. No respiratory distress.  Abdominal: Soft. Bowel sounds are normal. There is no tenderness.  Musculoskeletal: Normal range of motion.  Normal ROM of ankles  bilaterally.  No knee tenderness bilaterally.  No calf tenderness.  Neurovascularly intact.  Sensation normal.  Swelling to medial side of left ankle. No pitting edema. Calf size equal bilateral.   Neurological: She is alert and oriented to person, place, and time.  Strength normal bilaterally.   Skin: Skin is warm and dry.  Psychiatric: She has a normal mood and affect. Her behavior is normal.    ED Course  Procedures   DIAGNOSTIC STUDIES: Oxygen Saturation is 100% on room air, normal by my interpretation.    COORDINATION OF CARE:  4:37 PM Discussed course of care with pt which includes pain medication.  Pt understands and agrees.   Labs Reviewed - No data to display No results found.   1. Pain, dental   2. Bilateral ankle pain       MDM   Dental pain without associated with dental infection. No evidence of dental abscess. Patient is afebrile, non toxic appearing and swallowing secretions well. I gave patient referral to dentist and stressed the importance of dental follow up for ultimate management of dental pain. I will give pain control. Patient voices understanding and is agreeable to plan. Advised elevating legs, ibuprofen and ice for ankles. Resource guide given for PCP follow up. No concern for DVT. No exogenous estrogen, recent car or plane rides. Neurovascularly intact.   I personally performed the services described in this documentation, which was scribed in my presence. The recorded information has been reviewed and is accurate.        Trevor Mace, PA-C 06/06/12 1712

## 2012-06-07 NOTE — ED Provider Notes (Signed)
Medical screening examination/treatment/procedure(s) were performed by non-physician practitioner and as supervising physician I was immediately available for consultation/collaboration.   [name], MD 06/07/12 [phone]

## 2012-09-02 ENCOUNTER — Emergency Department (HOSPITAL_COMMUNITY)
Admission: EM | Admit: 2012-09-02 | Discharge: 2012-09-03 | Disposition: A | Discharge status: Home / Self Care | Payer: Self-pay | Attending: Emergency Medicine | Admitting: Emergency Medicine

## 2012-09-02 ENCOUNTER — Encounter (HOSPITAL_COMMUNITY): Payer: Self-pay | Admitting: Emergency Medicine

## 2012-09-02 DIAGNOSIS — Z3202 Encounter for pregnancy test, result negative: Secondary | ICD-10-CM | POA: Insufficient documentation

## 2012-09-02 DIAGNOSIS — IMO0002 Reserved for concepts with insufficient information to code with codable children: Secondary | ICD-10-CM | POA: Insufficient documentation

## 2012-09-02 DIAGNOSIS — F17200 Nicotine dependence, unspecified, uncomplicated: Secondary | ICD-10-CM | POA: Insufficient documentation

## 2012-09-02 DIAGNOSIS — L02412 Cutaneous abscess of left axilla: Secondary | ICD-10-CM

## 2012-09-02 DIAGNOSIS — Z8719 Personal history of other diseases of the digestive system: Secondary | ICD-10-CM | POA: Insufficient documentation

## 2012-09-02 NOTE — ED Notes (Addendum)
Patient with knot in left arm pit.  Patient states it is painful.  Patient is worried about CA due to how her mothers CA started.  Patient states that she could possibly be pregnant.

## 2012-09-03 NOTE — ED Provider Notes (Deleted)
History     CSN: [id number]  Arrival date & time 09/02/12  2159   First MD Initiated Contact with Patient 09/03/12 0011      Chief Complaint  Patient presents with  . Cyst    (Consider location/radiation/quality/duration/timing/severity/associated sxs/prior treatment) HPI Comments: Patient with small firm  painful nodule L axilla for the past 3 days no drainage.   Has not taken any meds PTA for pain concerned that it is cancer as her Mom has breast cancer   The history is provided by the patient.    Past Medical History  Diagnosis Date  . GERD (gastroesophageal reflux disease)     Past Surgical History  Procedure Laterality Date  . I&d extremity  08/20/2011    Procedure: IRRIGATION AND DEBRIDEMENT EXTREMITY;  Surgeon: Tami Ribas, MD;  Location: [facility] OR;  Service: Orthopedics;  Laterality: Right;  . Hand surgery      hand infection  . I&d extremity  11/04/2011    Procedure: IRRIGATION AND DEBRIDEMENT EXTREMITY;  Surgeon: Sharma Covert, MD;  Location: [facility] OR;  Service: Orthopedics;  Laterality: Left;  I & D ledft hand    History reviewed. No pertinent family history.  History  Substance Use Topics  . Smoking status: Current Every Day Smoker -- 1.00 packs/day for 13 years    Types: Cigarettes  . Smokeless tobacco: Never Used  . Alcohol Use: No    OB History   Grav Para Term Preterm Abortions TAB SAB Ect Mult Living                  Review of Systems  Constitutional: Negative for fever and chills.  Gastrointestinal: Negative for nausea.  Musculoskeletal: Negative for myalgias.  Skin: Positive for wound.  All other systems reviewed and are negative.    Allergies  Review of patient's allergies indicates no known allergies.  Home Medications  No current outpatient prescriptions on file.  BP 102/64  Pulse 56  Temp(Src) 98.1 F (36.7 C) (Oral)  Resp 16  SpO2 100%  LMP 07/09/2012  Physical Exam  Nursing note and vitals reviewed. Constitutional: She is  oriented to person, place, and time. She appears well-developed and well-nourished.  HENT:  Head: Normocephalic and atraumatic.  Eyes: Pupils are equal, round, and reactive to light.  Neck: Normal range of motion.  Cardiovascular: Normal rate and regular rhythm.   Musculoskeletal: Normal range of motion.  Neurological: She is alert and oriented to person, place, and time.  Skin: Skin is warm. There is erythema.  Small abscess L axilla     ED Course  Irrigation and debridement Date/Time: 09/03/2012 1:04 AM Performed by: Arman Filter Authorized by: Arman Filter Consent: Verbal consent obtained. Consent given by: patient Patient understanding: patient states understanding of the procedure being performed Patient identity confirmed: verbally with patient Time out: Immediately prior to procedure a "time out" was called to verify the correct patient, procedure, equipment, support staff and site/side marked as required. Local anesthesia used: yes Local anesthetic: lidocaine 1% with epinephrine Anesthetic total: 1 ml Patient sedated: no Patient tolerance: Patient tolerated the procedure well with no immediate complications.   (including critical care time)  Labs Reviewed  POCT PREGNANCY, URINE   No results found.   1. Abscess of axilla, left       MDM          Arman Filter, NP 09/03/12 1959

## 2012-09-03 NOTE — ED Notes (Signed)
Pt states she has a cyst under her left arm in her axillary area. Pt state that the cyst has increased in sized and she was worried about the growth.

## 2012-09-11 NOTE — ED Provider Notes (Signed)
History     CSN: [id number]  Arrival date & time 09/02/12  2159   First MD Initiated Contact with Patient 09/03/12 0011      Chief Complaint  Patient presents with  . Cyst    (Consider location/radiation/quality/duration/timing/severity/associated sxs/prior treatment) HPI Comments: Patient with small firm  painful nodule L axilla for the past 3 days no drainage.   Has not taken any meds PTA for pain concerned that it is cancer as her Mom has breast cancer   The history is provided by the patient.    Past Medical History  Diagnosis Date  . GERD (gastroesophageal reflux disease)     Past Surgical History  Procedure Laterality Date  . I&d extremity  08/20/2011    Procedure: IRRIGATION AND DEBRIDEMENT EXTREMITY;  Surgeon: Tami Ribas, MD;  Location: [facility] OR;  Service: Orthopedics;  Laterality: Right;  . Hand surgery      hand infection  . I&d extremity  11/04/2011    Procedure: IRRIGATION AND DEBRIDEMENT EXTREMITY;  Surgeon: Sharma Covert, MD;  Location: [facility] OR;  Service: Orthopedics;  Laterality: Left;  I & D ledft hand    History reviewed. No pertinent family history.  History  Substance Use Topics  . Smoking status: Current Every Day Smoker -- 1.00 packs/day for 13 years    Types: Cigarettes  . Smokeless tobacco: Never Used  . Alcohol Use: No    OB History   Grav Para Term Preterm Abortions TAB SAB Ect Mult Living                  Review of Systems  Constitutional: Negative for fever and chills.  Gastrointestinal: Negative for nausea.  Musculoskeletal: Negative for myalgias.  Skin: Positive for wound.  All other systems reviewed and are negative.    Allergies  Review of patient's allergies indicates no known allergies.  Home Medications  No current outpatient prescriptions on file.  BP 102/64  Pulse 56  Temp(Src) 98.1 F (36.7 C) (Oral)  Resp 16  SpO2 100%  LMP 07/09/2012  Physical Exam  Nursing note and vitals reviewed. Constitutional: She is  oriented to person, place, and time. She appears well-developed and well-nourished.  HENT:  Head: Normocephalic and atraumatic.  Eyes: Pupils are equal, round, and reactive to light.  Neck: Normal range of motion.  Cardiovascular: Normal rate and regular rhythm.   Musculoskeletal: Normal range of motion.  Neurological: She is alert and oriented to person, place, and time.  Skin: Skin is warm. There is erythema.  Small abscess L axilla     ED Course  INCISION AND DRAINAGE Date/Time: 09/11/2012 7:55 PM Performed by: Arman Filter Authorized by: Arman Filter Consent: Verbal consent obtained. Risks and benefits: risks, benefits and alternatives were discussed Consent given by: patient Patient understanding: patient states understanding of the procedure being performed Patient identity confirmed: verbally with patient Type: abscess Body area: upper extremity Location details: left arm Anesthesia: local infiltration Local anesthetic: lidocaine 1% without epinephrine Anesthetic total: 2 ml Patient sedated: no Scalpel size: 11 Incision type: single straight Complexity: simple Drainage: purulent Drainage amount: moderate Packing material: 1/4 in iodoform gauze Patient tolerance: Patient tolerated the procedure well with no immediate complications.   (including critical care time)  Labs Reviewed  POCT PREGNANCY, URINE   No results found.   1. Abscess of axilla, left       MDM   Abscess I&D       Arman Filter, NP  09/03/12 1959  Arman Filter, NP 09/11/12 1956  Arman Filter, NP 09/11/12 1957  Arman Filter, NP 09/11/12 1958

## 2012-09-12 ENCOUNTER — Encounter (HOSPITAL_COMMUNITY): Payer: Self-pay | Admitting: Emergency Medicine

## 2012-09-12 ENCOUNTER — Encounter (HOSPITAL_COMMUNITY): Payer: Self-pay

## 2012-09-12 ENCOUNTER — Emergency Department (HOSPITAL_COMMUNITY)
Admission: EM | Admit: 2012-09-12 | Discharge: 2012-09-12 | Disposition: A | Discharge status: Home / Self Care | Payer: Self-pay | Attending: Emergency Medicine | Admitting: Emergency Medicine

## 2012-09-12 DIAGNOSIS — Z3202 Encounter for pregnancy test, result negative: Secondary | ICD-10-CM | POA: Insufficient documentation

## 2012-09-12 DIAGNOSIS — R7989 Other specified abnormal findings of blood chemistry: Secondary | ICD-10-CM | POA: Insufficient documentation

## 2012-09-12 DIAGNOSIS — F17200 Nicotine dependence, unspecified, uncomplicated: Secondary | ICD-10-CM | POA: Insufficient documentation

## 2012-09-12 DIAGNOSIS — R450 Nervousness: Secondary | ICD-10-CM | POA: Insufficient documentation

## 2012-09-12 DIAGNOSIS — R110 Nausea: Secondary | ICD-10-CM | POA: Insufficient documentation

## 2012-09-12 DIAGNOSIS — F1110 Opioid abuse, uncomplicated: Secondary | ICD-10-CM | POA: Insufficient documentation

## 2012-09-12 DIAGNOSIS — Z8719 Personal history of other diseases of the digestive system: Secondary | ICD-10-CM | POA: Insufficient documentation

## 2012-09-12 HISTORY — DX: Other psychoactive substance abuse, uncomplicated: F19.10

## 2012-09-12 LAB — COMPREHENSIVE METABOLIC PANEL
AST: 49 U/L — ABNORMAL HIGH (ref 0–37)
BUN: 7 mg/dL (ref 6–23)
CO2: 26 mEq/L (ref 19–32)
Calcium: 9 mg/dL (ref 8.4–10.5)
Creatinine, Ser: 0.76 mg/dL (ref 0.50–1.10)
GFR calc Af Amer: >90 mL/min (ref >90)
GFR calc non Af Amer: >90 mL/min (ref >90)
Glucose, Bld: 112 mg/dL — ABNORMAL HIGH (ref 70–99)

## 2012-09-12 LAB — RAPID URINE DRUG SCREEN, HOSP PERFORMED
Amphetamines: NOT DETECTED
Barbiturates: NOT DETECTED
Benzodiazepines: POSITIVE — AB
Cocaine: NOT DETECTED
Opiates: POSITIVE — AB
Tetrahydrocannabinol: NOT DETECTED

## 2012-09-12 LAB — CBC
HCT: 39.2 % (ref 36.0–46.0)
MCH: 28.7 pg (ref 26.0–34.0)
MCV: 86 fL (ref 78.0–100.0)
Platelets: 242 10*3/uL (ref 150–400)
RBC: 4.56 MIL/uL (ref 3.87–5.11)

## 2012-09-12 LAB — ETHANOL: Alcohol, Ethyl (B): <11 mg/dL (ref 0–11)

## 2012-09-12 MED ORDER — NICOTINE 14 MG/24HR TD PT24: 14.0000 mg | MEDICATED_PATCH | Freq: Every day | TRANSDERMAL | Status: DC

## 2012-09-12 MED ORDER — ONDANSETRON HCL 4 MG PO TABS: 4.0000 mg | ORAL_TABLET | Freq: Three times a day (TID) | ORAL | Status: DC | PRN

## 2012-09-12 MED ORDER — ONDANSETRON HCL 8 MG PO TABS: 8.0000 mg | ORAL_TABLET | Freq: Two times a day (BID) | ORAL | Status: DC | PRN

## 2012-09-12 MED ORDER — LORAZEPAM 1 MG PO TABS: 1.0000 mg | ORAL_TABLET | Freq: Three times a day (TID) | ORAL | Status: DC | PRN

## 2012-09-12 NOTE — ED Provider Notes (Signed)
Medical screening examination/treatment/procedure(s) were performed by non-physician practitioner and as supervising physician I was immediately available for consultation/collaboration.  Asher Torpey M Delrick Dehart, MD 09/12/12 0327 

## 2012-09-12 NOTE — ED Notes (Signed)
Patient is requesting detox from heroin. Patient states she last used heroin yesterday and did 1/2 gram. Patient denies any other drugs or alcohol use. Patient denies SI/HI.

## 2012-09-12 NOTE — ED Notes (Signed)
Patient reports that she does not want treatment. Patient denies SI and HI and AVH at time of discharge.
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
## 2012-09-12 NOTE — ED Provider Notes (Addendum)
History     CSN: [id number]  Arrival date & time 09/12/12  1836   First MD Initiated Contact with Patient 09/12/12 1945      Chief Complaint  Patient presents with  . Medical Clearance  . Addiction Problem    heroin     Patient is a 32 y.o. female presenting with drug problem. The history is provided by the patient.  Drug Problem This is a chronic problem. The current episode started more than 1 week ago. The problem occurs constantly. The problem has been gradually worsening. Pertinent negatives include no chest pain, no abdominal pain and no shortness of breath. Nothing aggravates the symptoms. Nothing relieves the symptoms.    Past Medical History  Diagnosis Date  . GERD (gastroesophageal reflux disease)   . Substance abuse     Past Surgical History  Procedure Laterality Date  . I&d extremity  08/20/2011    Procedure: IRRIGATION AND DEBRIDEMENT EXTREMITY;  Surgeon: Tami Ribas, MD;  Location: [facility] OR;  Service: Orthopedics;  Laterality: Right;  . Hand surgery      hand infection  . I&d extremity  11/04/2011    Procedure: IRRIGATION AND DEBRIDEMENT EXTREMITY;  Surgeon: Sharma Covert, MD;  Location: [facility] OR;  Service: Orthopedics;  Laterality: Left;  I & D ledft hand    Family History  Problem Relation Age of Onset  . Cancer Mother   . Diabetes Mother   . Hyperlipidemia Mother   . Hypertension Father   . Hyperlipidemia Father     History  Substance Use Topics  . Smoking status: Current Every Day Smoker -- 1.00 packs/day for 13 years    Types: Cigarettes  . Smokeless tobacco: Never Used  . Alcohol Use: No    OB History   Grav Para Term Preterm Abortions TAB SAB Ect Mult Living                  Review of Systems  Constitutional: Negative for fever.  Respiratory: Negative for shortness of breath.   Cardiovascular: Negative for chest pain.  Gastrointestinal: Positive for nausea. Negative for vomiting and abdominal pain.  Neurological: Negative for weakness.   Psychiatric/Behavioral: Negative for suicidal ideas. The patient is nervous/anxious.   All other systems reviewed and are negative.    Allergies  Review of patient's allergies indicates no known allergies.  Home Medications   Current Outpatient Rx  Name  Route  Sig  Dispense  Refill  . ibuprofen (ADVIL,MOTRIN) 200 MG tablet   Oral   Take 200 mg by mouth every 6 (six) hours as needed for pain.           BP 116/70  Pulse 71  Temp(Src) 98.7 F (37.1 C) (Oral)  Resp 18  Ht 5\' 6"  (1.676 m)  Wt 180 lb 4 oz (81.761 kg)  BMI 29.11 kg/m2  SpO2 100%  LMP 09/11/2012  Physical Exam CONSTITUTIONAL: Well developed/well nourished HEAD: Normocephalic/atraumatic EYES: EOMI/PERRL ENMT: Mucous membranes moist NECK: supple no meningeal signs CV: S1/S2 noted, no murmurs/rubs/gallops noted LUNGS: Lungs are clear to auscultation bilaterally, no apparent distress ABDOMEN: soft, nontender, no rebound or guarding NEURO: Pt is awake/alert, moves all extremitiesx4 EXTREMITIES: pulses normal, full ROM Healed lesions on forearms, no signs of abscess/cellulitis SKIN: warm, color normal PSYCH:mildly anxious  ED Course  Procedures   Labs Reviewed  COMPREHENSIVE METABOLIC PANEL - Abnormal; Notable for the following:    Glucose, Bld 112 (*)    Albumin 3.2 (*)  AST 49 (*)    ALT 126 (*)    All other components within normal limits  SALICYLATE LEVEL - Abnormal; Notable for the following:    Salicylate Lvl <2.0 (*)    All other components within normal limits  ACETAMINOPHEN LEVEL  CBC  ETHANOL  URINE RAPID DRUG SCREEN (HOSP PERFORMED)  POCT PREGNANCY, URINE    Call placed to [facility] Pt stable   9:27 PM Pt now decides to leave She has no SI She has a daughter but grandmother is present to help assist   MDM  Nursing notes including past medical history and social history reviewed and considered in documentation Labs/vital reviewed and considered         Joya Gaskins, MD 09/12/12 2019  Joya Gaskins, MD 09/12/12 2128

## 2013-03-14 ENCOUNTER — Encounter (HOSPITAL_COMMUNITY): Payer: Self-pay | Admitting: Emergency Medicine

## 2013-03-14 ENCOUNTER — Emergency Department (HOSPITAL_COMMUNITY)
Admission: EM | Admit: 2013-03-14 | Discharge: 2013-03-14 | Disposition: A | Discharge status: Home / Self Care | Payer: Self-pay | Attending: Emergency Medicine | Admitting: Emergency Medicine

## 2013-03-14 ENCOUNTER — Emergency Department (HOSPITAL_COMMUNITY): Payer: Self-pay

## 2013-03-14 DIAGNOSIS — F17200 Nicotine dependence, unspecified, uncomplicated: Secondary | ICD-10-CM | POA: Insufficient documentation

## 2013-03-14 DIAGNOSIS — IMO0002 Reserved for concepts with insufficient information to code with codable children: Secondary | ICD-10-CM | POA: Insufficient documentation

## 2013-03-14 DIAGNOSIS — Z23 Encounter for immunization: Secondary | ICD-10-CM | POA: Insufficient documentation

## 2013-03-14 DIAGNOSIS — L0291 Cutaneous abscess, unspecified: Secondary | ICD-10-CM

## 2013-03-14 DIAGNOSIS — Z8719 Personal history of other diseases of the digestive system: Secondary | ICD-10-CM | POA: Insufficient documentation

## 2013-03-14 MED ORDER — OXYCODONE-ACETAMINOPHEN 5-325 MG PO TABS
2.0000 | ORAL_TABLET | Freq: Once | ORAL | Status: AC
  Administered 2013-03-14: 2 via ORAL
  Filled 2013-03-14: qty 2

## 2013-03-14 MED ORDER — NAPROXEN 500 MG PO TABS: 500.0000 mg | ORAL_TABLET | Freq: Two times a day (BID) | ORAL | Status: DC

## 2013-03-14 MED ORDER — OXYCODONE-ACETAMINOPHEN 5-325 MG PO TABS: 1.0000 | ORAL_TABLET | Freq: Four times a day (QID) | ORAL | Status: DC | PRN

## 2013-03-14 MED ORDER — CLINDAMYCIN HCL 300 MG PO CAPS: 300.0000 mg | ORAL_CAPSULE | Freq: Four times a day (QID) | ORAL | Status: DC

## 2013-03-14 MED ORDER — TETANUS-DIPHTH-ACELL PERTUSSIS 5-2.5-18.5 LF-MCG/0.5 IM SUSP
0.5000 mL | Freq: Once | INTRAMUSCULAR | Status: AC
  Administered 2013-03-14: 0.5 mL via INTRAMUSCULAR
  Filled 2013-03-14: qty 0.5

## 2013-03-14 NOTE — ED Provider Notes (Signed)
CSN: [id number]     Arrival date & time 03/14/13  4098 History   First MD Initiated Contact with Patient 03/14/13 [phone]     Chief Complaint  Patient presents with  . Abscess   (Consider location/radiation/quality/duration/timing/severity/associated sxs/prior Treatment) Patient is a 32 y.o. female presenting with abscess.  Abscess 32 yo female presents with abscess in RIGHT antecubital fossa since Saturday. Pain and abscess progressively worsening. Pain 8/10 without drainage, and no joint tenderness. Admits to IVDU. Denies Fever/chills, N/V, CP, SOB, Dizziness, HA.   Past Medical History  Diagnosis Date  . GERD (gastroesophageal reflux disease)   . Substance abuse    Past Surgical History  Procedure Laterality Date  . I&d extremity  08/20/2011    Procedure: IRRIGATION AND DEBRIDEMENT EXTREMITY;  Surgeon: Tami Ribas, MD;  Location: [facility] OR;  Service: Orthopedics;  Laterality: Right;  . Hand surgery      hand infection  . I&d extremity  11/04/2011    Procedure: IRRIGATION AND DEBRIDEMENT EXTREMITY;  Surgeon: Sharma Covert, MD;  Location: [facility] OR;  Service: Orthopedics;  Laterality: Left;  I & D ledft hand   Family History  Problem Relation Age of Onset  . Cancer Mother   . Diabetes Mother   . Hyperlipidemia Mother   . Hypertension Father   . Hyperlipidemia Father    History  Substance Use Topics  . Smoking status: Current Every Day Smoker -- 1.00 packs/day for 13 years    Types: Cigarettes  . Smokeless tobacco: Never Used  . Alcohol Use: No   OB History   Grav Para Term Preterm Abortions TAB SAB Ect Mult Living                 Review of Systems  Skin: Negative for rash.  All other systems reviewed and are negative.    Allergies  Review of patient's allergies indicates no known allergies.  Home Medications   Current Outpatient Rx  Name  Route  Sig  Dispense  Refill  . ibuprofen (ADVIL,MOTRIN) 200 MG tablet   Oral   Take 200 mg by mouth every 6 (six) hours as  needed for pain.          BP 131/66  Temp(Src) 98.3 F (36.8 C) (Oral)  Resp 18  SpO2 99%  LMP 02/22/2013 Physical Exam  Nursing note and vitals reviewed. Constitutional: She is oriented to person, place, and time. She appears well-developed and well-nourished. No distress.  HENT:  Head: Normocephalic and atraumatic.  Cardiovascular: Normal rate and regular rhythm.  Exam reveals no gallop and no friction rub.   No murmur heard. Pulmonary/Chest: Effort normal and breath sounds normal. No respiratory distress. She has no wheezes. She has no rales.  Musculoskeletal: Normal range of motion. She exhibits no edema.  Neurological: She is alert and oriented to person, place, and time.  Skin: Skin is warm and dry. She is not diaphoretic.     Psychiatric: She has a normal mood and affect. Her behavior is normal.    ED Course  Procedures (including critical care time) Labs Review Labs Reviewed - No data to display Imaging Review No results found.  EKG Interpretation   None      INCISION AND DRAINAGE Performed by: Rudene Anda Consent: Verbal consent obtained. Risks and benefits: risks, benefits and alternatives were discussed Time out performed prior to procedure Type: abscess Body area: RIGHT antecubital fossa Anesthesia: local infiltration  Incision was made with a scalpel. Local anesthetic:  lidocaine 2% w/ epinephrine Anesthetic total: 4 ml Complexity: complex Blunt dissection to break up loculations Drainage: purulent Drainage amount: 20+ mL Packing material: iodoform gauze Patient tolerance: Patient tolerated the procedure well with no immediate complications.  MDM   1. Abscess and cellulitis    The wound is cleansed, debrided of foreign material as much as possible, and dressed. The patient is alerted to watch for any signs of infection (redness, pus, pain, increased swelling or fever) and call if such occurs. Tetanus vaccination status reviewed: Td  vaccination indicated and given today.Patient educated about risks of IVDU. Patient pain treated in ED. Patient advised to return in 2 days for packing removal and reassessment of abscess. Patient discharged in good condition with home meds. Patient agrees with plan.  Meds given in ED:  Medications  oxyCODONE-acetaminophen (PERCOCET/ROXICET) 5-325 MG per tablet 2 tablet (2 tablets Oral Given 03/14/13 0606)  Tdap (BOOSTRIX) injection 0.5 mL (0.5 mLs Intramuscular Given 03/14/13 0606)    Discharge Medication List as of 03/14/2013  6:03 AM    START taking these medications   Details  clindamycin (CLEOCIN) 300 MG capsule Take 1 capsule (300 mg total) by mouth 4 (four) times daily. X 7 days, Starting 03/14/2013, Until Discontinued, Print    naproxen (NAPROSYN) 500 MG tablet Take 1 tablet (500 mg total) by mouth 2 (two) times daily., Starting 03/14/2013, Until Discontinued, Print    oxyCODONE-acetaminophen (PERCOCET) 5-325 MG per tablet Take 1 tablet by mouth every 6 (six) hours as needed., Starting 03/14/2013, Until Discontinued, Print            Allen Norris [place], [place] 03/14/13 [phone]

## 2013-03-14 NOTE — ED Provider Notes (Signed)
32 year old female, history of intravenous heroin abuse, presents with right antecubital erythema and swelling. This has been present for several days, gradually getting worse and on exam has a large area of induration and fluctuance consistent with an abscess. There is associated surrounding cellulitis but she denies systemic symptoms including fevers nausea or vomiting. I performed a bedside ultrasound and have found that there is a large area of purulent material in need of incision and drainage, she will also need antibiotics, close followup for wound check.  I counseled the patient regarding her intravenous heroin use and drug use, understanding expressed for the indications for return in 8 to stop using drugs  Medical screening examination/treatment/procedure(s) were conducted as a shared visit with non-physician practitioner(s) and myself.  I personally evaluated the patient during the encounter.  Clinical Impression: Abscess of R upper ext.      Vida Roller, MD 03/14/13 [phone]

## 2013-03-14 NOTE — ED Notes (Signed)
Pt noticed abscess on her arm on Saturday morning, It is located in the fold of her right elbow

## 2014-07-12 IMAGING — CR DG WRIST COMPLETE 3+V*R*
4 series · 4 of 4 positions shown · non-contrast
Comparison: None.

CLINICAL DATA: Hand pain/swelling

RIGHT WRIST - COMPLETE 3+ VIEW

[x wrist pa right]
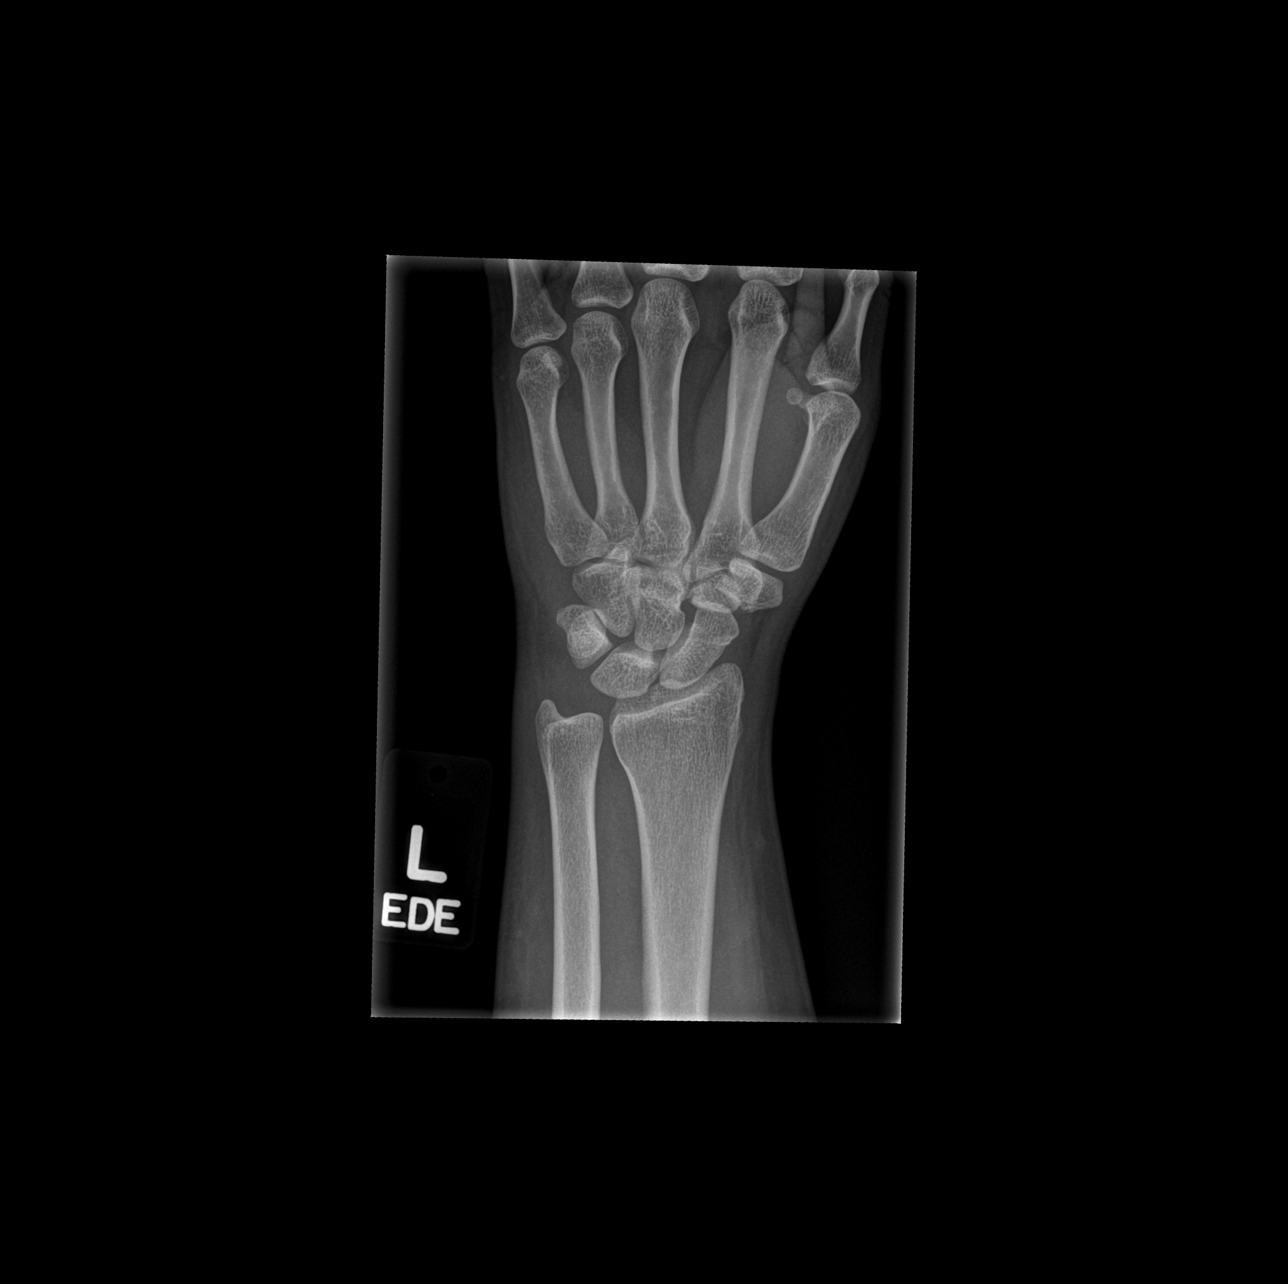

[x wrist navicular view right]
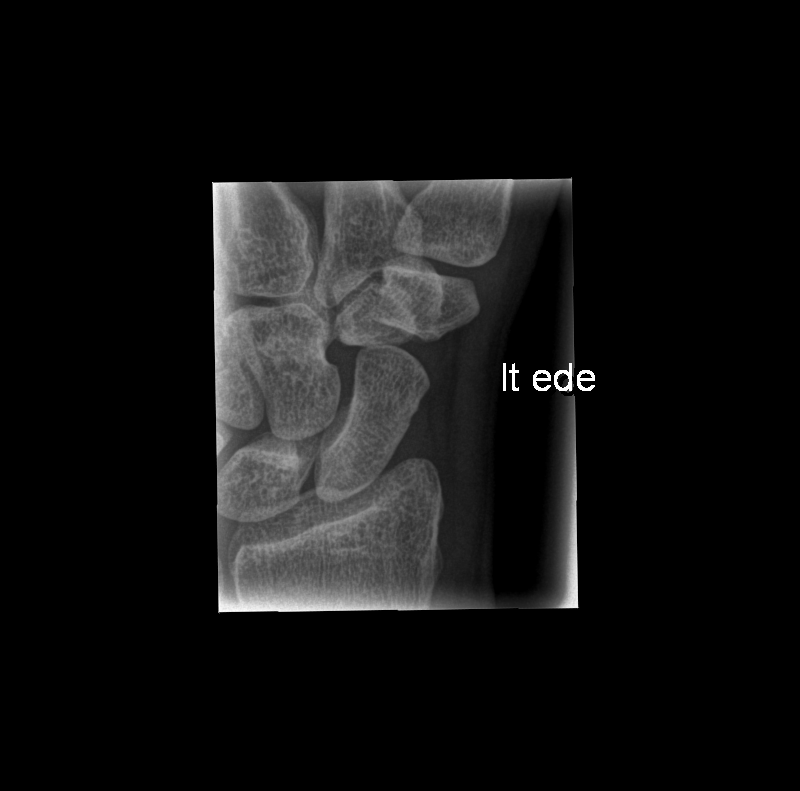

[x wrist obl right]
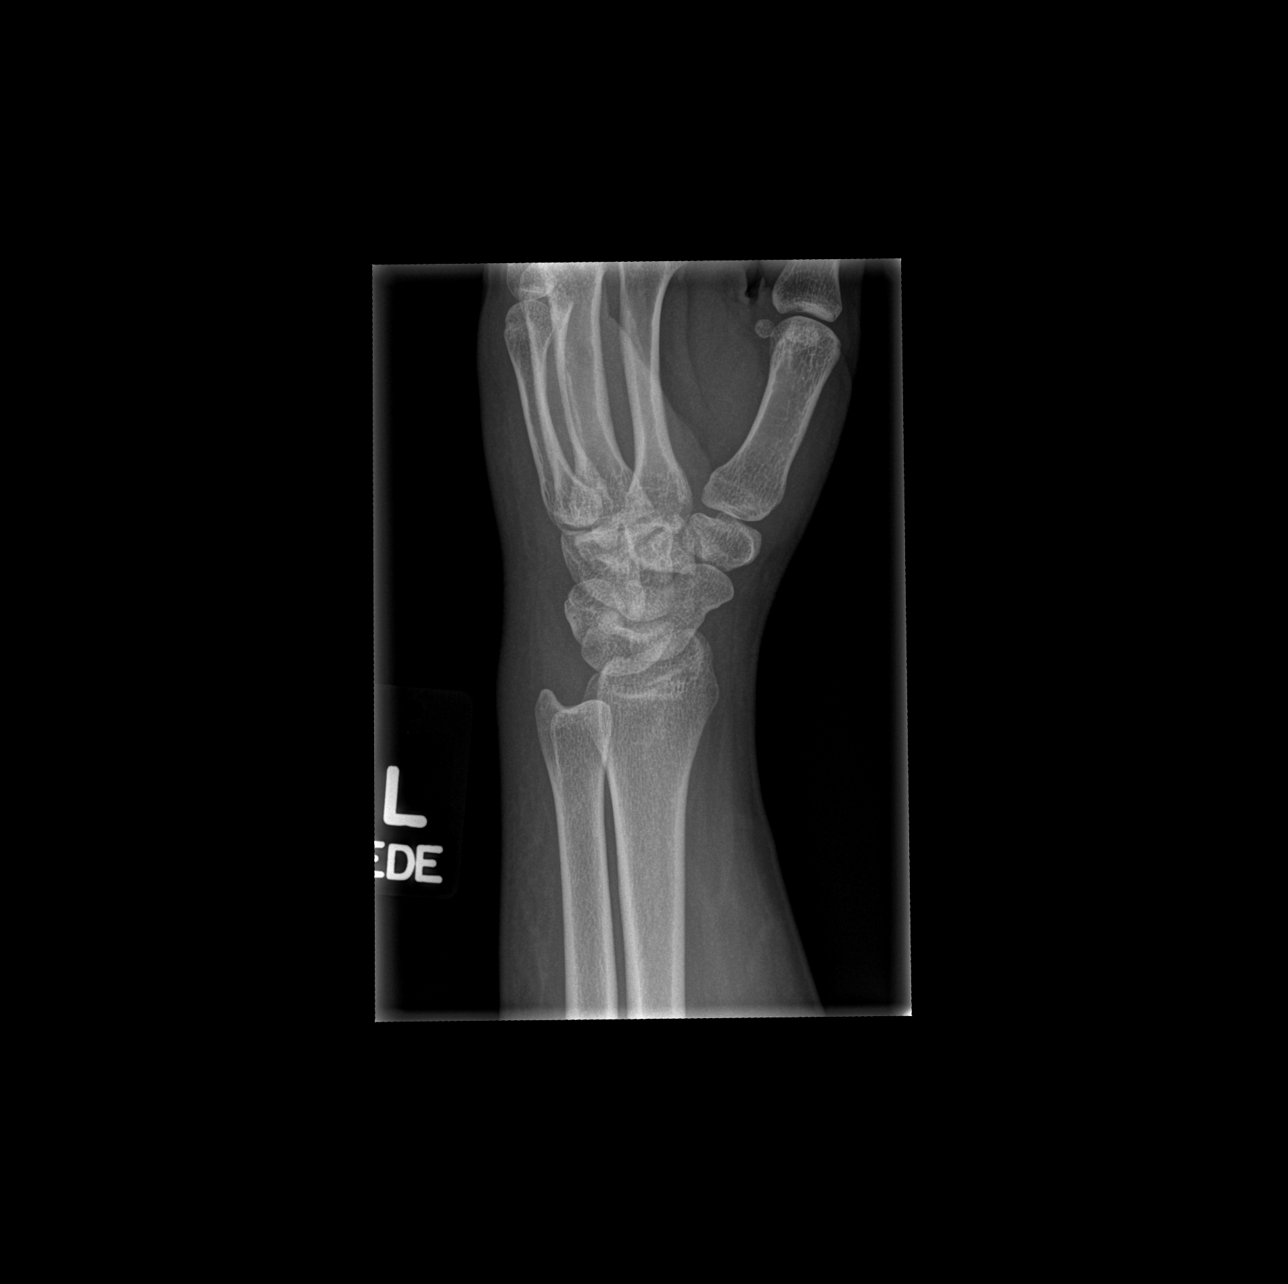

[x wrist lat right]
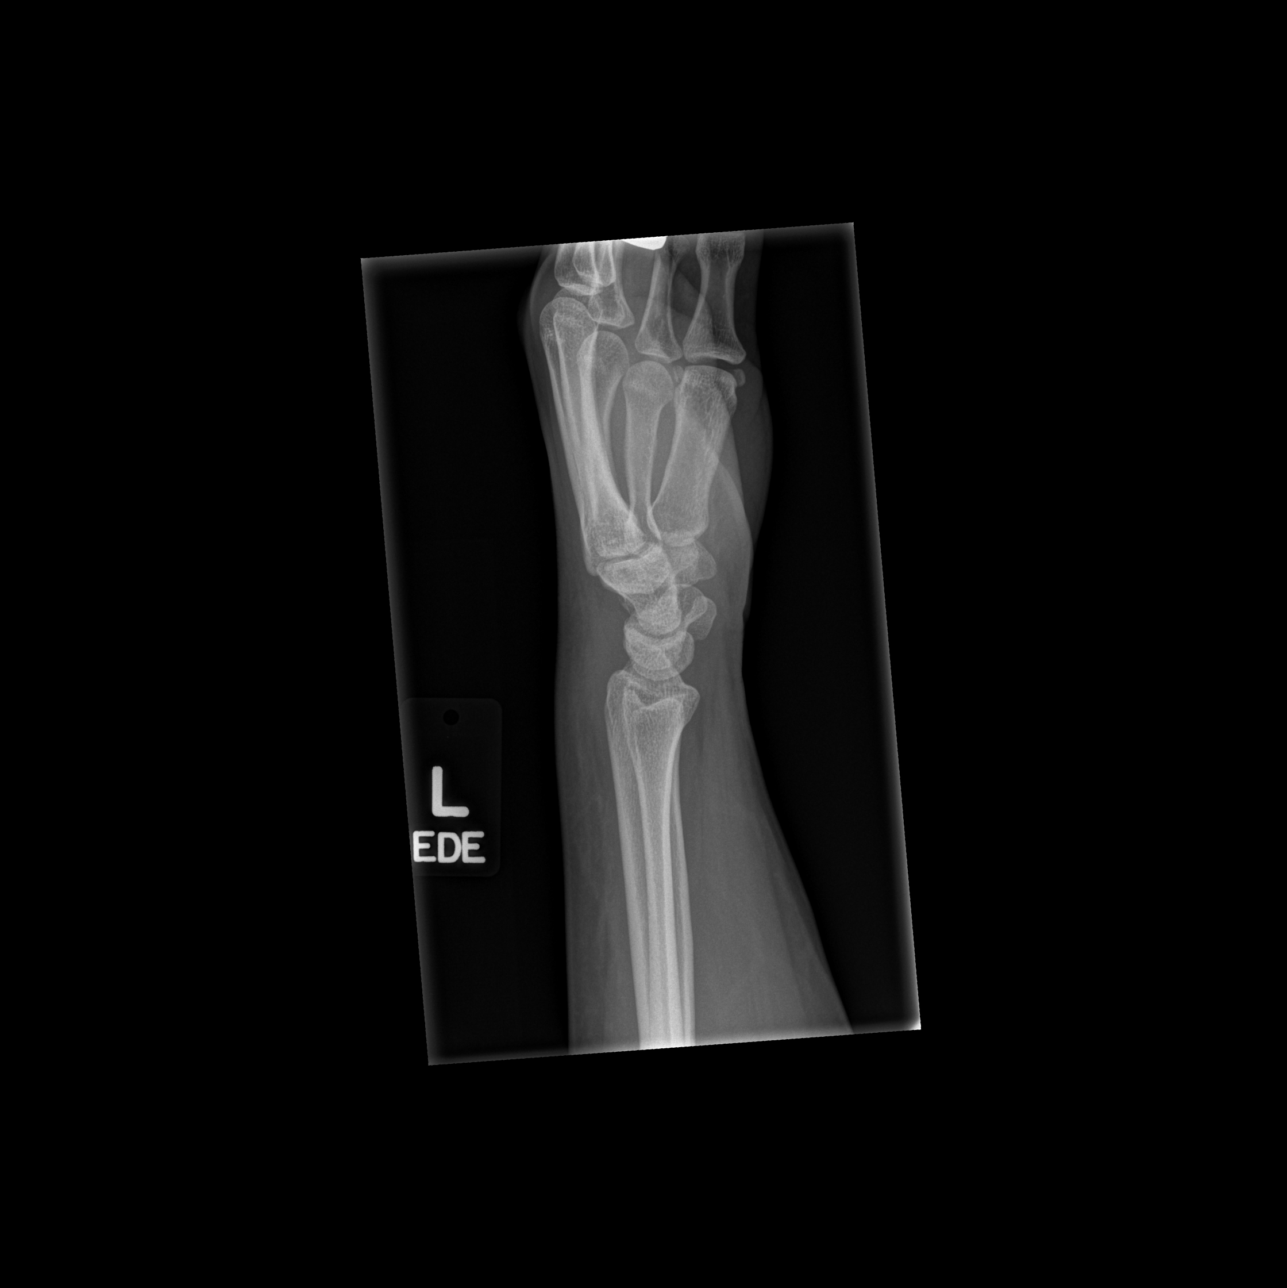

[4 of 4 positions shown; findings below may reference images not displayed]

FINDINGS: No fracture or dislocation is seen.

The joint spaces are preserved.

The visualized soft tissues are unremarkable.
IMPRESSION: No fracture or dislocation is seen.

## 2014-08-08 ENCOUNTER — Emergency Department (HOSPITAL_COMMUNITY)
Admission: EM | Admit: 2014-08-08 | Discharge: 2014-08-08 | Disposition: A | Discharge status: Home / Self Care | Payer: Self-pay | Attending: Emergency Medicine | Admitting: Emergency Medicine

## 2014-08-08 ENCOUNTER — Emergency Department (HOSPITAL_COMMUNITY): Payer: Self-pay

## 2014-08-08 ENCOUNTER — Encounter (HOSPITAL_COMMUNITY): Payer: Self-pay

## 2014-08-08 DIAGNOSIS — Z8719 Personal history of other diseases of the digestive system: Secondary | ICD-10-CM | POA: Insufficient documentation

## 2014-08-08 DIAGNOSIS — N939 Abnormal uterine and vaginal bleeding, unspecified: Secondary | ICD-10-CM

## 2014-08-08 DIAGNOSIS — Z720 Tobacco use: Secondary | ICD-10-CM | POA: Insufficient documentation

## 2014-08-08 DIAGNOSIS — Z3202 Encounter for pregnancy test, result negative: Secondary | ICD-10-CM | POA: Insufficient documentation

## 2014-08-08 DIAGNOSIS — N938 Other specified abnormal uterine and vaginal bleeding: Secondary | ICD-10-CM | POA: Insufficient documentation

## 2014-08-08 LAB — CBC WITH DIFFERENTIAL/PLATELET
BASOS ABS: 0.1 10*3/uL (ref 0.0–0.1)
BASOS PCT: 1 % (ref 0–1)
EOS ABS: 0.2 10*3/uL (ref 0.0–0.7)
EOS PCT: 2 % (ref 0–5)
HEMATOCRIT: 43.3 % (ref 36.0–46.0)
HEMOGLOBIN: 14.3 g/dL (ref 12.0–15.0)
LYMPHS PCT: 44 % (ref 12–46)
Lymphs Abs: 4.3 10*3/uL — ABNORMAL HIGH (ref 0.7–4.0)
MCH: 29.1 pg (ref 26.0–34.0)
MCHC: 33 g/dL (ref 30.0–36.0)
MCV: 88 fL (ref 78.0–100.0)
MONO ABS: 0.4 10*3/uL (ref 0.1–1.0)
Monocytes Relative: 4 % (ref 3–12)
Neutro Abs: 5 10*3/uL (ref 1.7–7.7)
Neutrophils Relative %: 51 % (ref 43–77)
PLATELETS: 198 10*3/uL (ref 150–400)
RBC: 4.92 MIL/uL (ref 3.87–5.11)
RDW: 15 % (ref 11.5–15.5)
WBC: 9.9 10*3/uL (ref 4.0–10.5)

## 2014-08-08 LAB — URINALYSIS, ROUTINE W REFLEX MICROSCOPIC
Bilirubin Urine: NEGATIVE
Glucose, UA: NEGATIVE mg/dL
KETONES UR: NEGATIVE mg/dL
LEUKOCYTES UA: NEGATIVE
NITRITE: NEGATIVE
PROTEIN: NEGATIVE mg/dL
Specific Gravity, Urine: 1.012 (ref 1.005–1.030)
UROBILINOGEN UA: 0.2 mg/dL (ref 0.0–1.0)
pH: 6 (ref 5.0–8.0)

## 2014-08-08 LAB — WET PREP, GENITAL
CLUE CELLS WET PREP: NONE SEEN
Trich, Wet Prep: NONE SEEN
YEAST WET PREP: NONE SEEN

## 2014-08-08 LAB — URINE MICROSCOPIC-ADD ON

## 2014-08-08 LAB — POC URINE PREG, ED: PREG TEST UR: NEGATIVE

## 2014-08-08 NOTE — ED Provider Notes (Signed)
CSN: [id number]     Arrival date & time 08/08/14  1216 History   First MD Initiated Contact with Patient 08/08/14 1225     Chief Complaint  Patient presents with  . Vaginal Bleeding     (Consider location/radiation/quality/duration/timing/severity/associated sxs/prior Treatment) Patient is a 34 y.o. female presenting with vaginal bleeding. The history is provided by the patient and the spouse. No language interpreter was used.  Vaginal Bleeding Associated symptoms: no abdominal pain and no dizziness   Ms. Daphine DeutscherMartin is a 34 y.o female with a history of substance abuse and gerd who presents for 12 days of vaginal bleeding.  She states she her LMP was 3 1/2 weeks ago. She states that she has no other symptoms. She has one sexual partner and is not on birth control.  She states she has been going through one tampon per day.  She denies any dizziness, pelvic pain, abdominal, pain, nausea, vomiting, dysuria, hematuria, or rectal bleeding.  Past Medical History  Diagnosis Date  . GERD (gastroesophageal reflux disease)   . Substance abuse    Past Surgical History  Procedure Laterality Date  . I&d extremity  08/20/2011    Procedure: IRRIGATION AND DEBRIDEMENT EXTREMITY;  Surgeon: Tami RibasKevin R Kuzma, MD;  [facility];  Service: Orthopedics;  Laterality: Right;  . Hand surgery      hand infection  . I&d extremity  11/04/2011    Procedure: IRRIGATION AND DEBRIDEMENT EXTREMITY;  Surgeon: Sharma CovertFred W Ortmann, MD;  [facility];  Service: Orthopedics;  Laterality: Left;  I & D ledft hand   Family History  Problem Relation Age of Onset  . Cancer Mother   . Diabetes Mother   . Hyperlipidemia Mother   . Hypertension Father   . Hyperlipidemia Father    History  Substance Use Topics  . Smoking status: Current Every Day Smoker -- 1.00 packs/day for 13 years    Types: Cigarettes  . Smokeless tobacco: Never Used  . Alcohol Use: No   OB History    No data available     Review of Systems   Gastrointestinal: Negative for abdominal pain.  Genitourinary: Positive for vaginal bleeding. Negative for pelvic pain.  Neurological: Negative for dizziness and syncope.      Allergies  Review of patient's allergies indicates no known allergies.  Home Medications   Prior to Admission medications   Medication Sig Start Date End Date Taking? Authorizing Provider  clindamycin (CLEOCIN) 300 MG capsule Take 1 capsule (300 mg total) by mouth 4 (four) times daily. X 7 days Patient not taking: Reported on 08/08/2014 03/14/13   Cristobal GoldmannJacob Lackey, PA-C  naproxen (NAPROSYN) 500 MG tablet Take 1 tablet (500 mg total) by mouth 2 (two) times daily. Patient not taking: Reported on 08/08/2014 03/14/13   Cristobal GoldmannJacob Lackey, PA-C  oxyCODONE-acetaminophen (PERCOCET) 5-325 MG per tablet Take 1 tablet by mouth every 6 (six) hours as needed. Patient not taking: Reported on 08/08/2014 03/14/13   Cristobal GoldmannJacob Lackey, PA-C   BP 96/58 mmHg  Pulse 53  Temp(Src) 97.7 F (36.5 C) (Oral)  Resp 18  Ht 5\' 6"  (1.676 m)  Wt 181 lb (82.101 kg)  BMI 29.23 kg/m2  SpO2 99%  LMP 07/09/2014 (Exact Date) Physical Exam  Constitutional: She is oriented to person, place, and time. She appears well-developed and well-nourished.  HENT:  Head: Normocephalic and atraumatic.  Eyes: Conjunctivae are normal.  Cardiovascular: Normal rate, regular rhythm and normal heart sounds.   Pulmonary/Chest: Effort normal.  Abdominal: Soft.  She exhibits no mass. There is no tenderness. There is no guarding.  Genitourinary:  Pelvic exam: Chaperone presents.  No vaginal bleeding. Moderate amount of yellow discharge.  Cervical os is closed. No adnexal tenderness.   Musculoskeletal: Normal range of motion.  Neurological: She is alert and oriented to person, place, and time.  Skin: Skin is warm and dry.    ED Course  Procedures (including critical care time) Labs Review Labs Reviewed  WET PREP, GENITAL - Abnormal; Notable for the following:    WBC, Wet  Prep HPF POC MODERATE (*)    All other components within normal limits  URINALYSIS, ROUTINE W REFLEX MICROSCOPIC - Abnormal; Notable for the following:    Hgb urine dipstick TRACE (*)    All other components within normal limits  CBC WITH DIFFERENTIAL/PLATELET - Abnormal; Notable for the following:    Lymphs Abs 4.3 (*)    All other components within normal limits  URINE MICROSCOPIC-ADD ON - Abnormal; Notable for the following:    Squamous Epithelial / LPF FEW (*)    All other components within normal limits  POC URINE PREG, ED  GC/CHLAMYDIA PROBE AMP ([place])    Imaging Review [place] Transvaginal Non-ob  08/08/2014   CLINICAL DATA:  Vaginal bleeding  EXAM: TRANSABDOMINAL AND TRANSVAGINAL ULTRASOUND OF PELVIS  TECHNIQUE: Both transabdominal and transvaginal ultrasound examinations of the pelvis were performed. Transabdominal technique was performed for global imaging of the pelvis including uterus, ovaries, adnexal regions, and pelvic cul-de-sac. It was necessary to proceed with endovaginal exam following the transabdominal exam to visualize the endometrium and ovaries.  COMPARISON:  None  FINDINGS: Uterus  Measurements: 7.4 x 3.7 x 4 cm. No fibroids or other mass visualized.  Endometrium  Thickness: 6.7 mm.  No focal abnormality visualized.  Right ovary  Measurements: 4.3 x 2.6 x 2.2 cm. Normal appearance/no adnexal mass.  Left ovary  Measurements: 3.7 x 2.8 x 2.6 cm. Normal appearance/no adnexal mass.  Other findings  No free fluid.  IMPRESSION: Normal pelvic ultrasound. If bleeding remains unresponsive to hormonal or medical therapy, sonohysterogram should be considered for focal lesion work-up. (Ref: Radiological Reasoning: Algorithmic Workup of Abnormal Vaginal Bleeding with Endovaginal Sonography and Sonohysterography. AJR 2008; [id number])   Electronically Signed   By: Elige KoHetal  Patel   On: 08/08/2014 15:38   [place] Pelvis Complete  08/08/2014   CLINICAL DATA:  Vaginal bleeding  EXAM:  TRANSABDOMINAL AND TRANSVAGINAL ULTRASOUND OF PELVIS  TECHNIQUE: Both transabdominal and transvaginal ultrasound examinations of the pelvis were performed. Transabdominal technique was performed for global imaging of the pelvis including uterus, ovaries, adnexal regions, and pelvic cul-de-sac. It was necessary to proceed with endovaginal exam following the transabdominal exam to visualize the endometrium and ovaries.  COMPARISON:  None  FINDINGS: Uterus  Measurements: 7.4 x 3.7 x 4 cm. No fibroids or other mass visualized.  Endometrium  Thickness: 6.7 mm.  No focal abnormality visualized.  Right ovary  Measurements: 4.3 x 2.6 x 2.2 cm. Normal appearance/no adnexal mass.  Left ovary  Measurements: 3.7 x 2.8 x 2.6 cm. Normal appearance/no adnexal mass.  Other findings  No free fluid.  IMPRESSION: Normal pelvic ultrasound. If bleeding remains unresponsive to hormonal or medical therapy, sonohysterogram should be considered for focal lesion work-up. (Ref: Radiological Reasoning: Algorithmic Workup of Abnormal Vaginal Bleeding with Endovaginal Sonography and Sonohysterography. AJR 2008; 295:[id number]:[id number])   Electronically Signed   By: Elige KoHetal  Patel   On: 08/08/2014 15:38     EKG Interpretation None  MDM   Final diagnoses:  Vaginal bleeding  Patient presents for vaginal bleeding that began 12 days ago.  She had a normal 7 day menstrual cycle 2 weeks prior to the start of the vaginal bleeding. She has been using one tampon per day.  She has normal labs and her hGB is 14.3.  She is not dizzy and has not had a syncopal episode.  She denies any weakness or lightheadedness. No UTI and she is not pregnant. She has a normal pelvic ultrasound.   I think she can follow up with GYN at the [facility] outpatient clinic and she agrees with the plan.      Catha Gosselin, PA-C 08/08/14 1555  Benjiman Core, MD 08/09/14 1012

## 2014-08-08 NOTE — ED Notes (Signed)
Patient transported to Ultrasound 

## 2014-08-08 NOTE — ED Notes (Signed)
Pt presents with report of vaginal bleeding.  Pt reports having a period x 1 month ago, with bleeding starting again x 2 weeks and it continues.  Pt reports flow is very light, reports onset of suprapubic pain that began this morning.  Pt denies dysuria, denies nausea,vomiting or diarrhea.

## 2014-08-08 NOTE — Discharge Instructions (Signed)
Abnormal Uterine Bleeding Follow up with GYN. Your hemoglobin was normal today.  Abnormal uterine bleeding can affect women at various stages in life, including teenagers, women in their reproductive years, pregnant women, and women who have reached menopause. Several kinds of uterine bleeding are considered abnormal, including:  Bleeding or spotting between periods.   Bleeding after sexual intercourse.   Bleeding that is heavier or more than normal.   Periods that last longer than usual.  Bleeding after menopause.  Many cases of abnormal uterine bleeding are minor and simple to treat, while others are more serious. Any type of abnormal bleeding should be evaluated by your health care provider. Treatment will depend on the cause of the bleeding. HOME CARE INSTRUCTIONS Monitor your condition for any changes. The following actions may help to alleviate any discomfort you are experiencing:  Avoid the use of tampons and douches as directed by your health care provider.  Change your pads frequently. You should get regular pelvic exams and Pap tests. Keep all follow-up appointments for diagnostic tests as directed by your health care provider.  SEEK MEDICAL CARE IF:   Your bleeding lasts more than 1 week.   You feel dizzy at times.  SEEK IMMEDIATE MEDICAL CARE IF:   You pass out.   You are changing pads every 15 to 30 minutes.   You have abdominal pain.  You have a fever.   You become sweaty or weak.   You are passing large blood clots from the vagina.   You start to feel nauseous and vomit. MAKE SURE YOU:   Understand these instructions.  Will watch your condition.  Will get help right away if you are not doing well or get worse. Document Released: 03/22/2005 Document Revised: 03/27/2013 Document Reviewed: 10/19/2012 [facility] Patient Information 2015 [place] [place]. This information is not intended to replace advice given to you by your health care provider.  Make sure you discuss any questions you have with your health care provider.

## 2014-08-08 NOTE — ED Notes (Signed)
Returned from U/S

## 2014-08-09 LAB — GC/CHLAMYDIA PROBE AMP (~~LOC~~) NOT AT ARMC
CHLAMYDIA, DNA PROBE: NEGATIVE
Neisseria Gonorrhea: NEGATIVE

## 2015-04-06 NOTE — L&D Delivery Note (Signed)
Delivery Note Pt presented for IOL for postdates. Labor was augmented with Cytotec. After she dilated to 9cm, her progression slowed and she was started on Pitocin. She continued to progress. At 10:34 AM a viable female was delivered via Vaginal, Spontaneous Delivery (Presentation: Compound, Right Occiput Anterior).  APGAR: 9, 9; weight pending.   Placenta status: Intact, Spontaneous.  Cord: 3 vessels with the following complications: None.  Cord pH: n/a  Anesthesia: Epidural  Episiotomy: None Lacerations: Right and left labial lacerations that required repair. Suture Repair: 3.0 vicryl Est. Blood Loss (mL): 290  Mom to postpartum.  Baby to Couplet care / Skin to Skin.  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  She progressed with augmentation via cytotec, FB, and then Pit close to the end of 1st stage.  I was gloved and present for delivery in its entirety.  Second stage of labor progressed, baby delivered after about 8 contractions.  No decels during second stage noted.  Complications: none  Lacerations: bilat labial  EBL: 290cc  Cam HaiSHAW, KIMBERLY, CNM 2:54 PM  07/11/2015

## 2015-05-24 ENCOUNTER — Encounter (HOSPITAL_COMMUNITY): Payer: Self-pay | Admitting: *Deleted

## 2015-05-24 ENCOUNTER — Inpatient Hospital Stay (HOSPITAL_COMMUNITY)
Admission: AD | Admit: 2015-05-24 | Discharge: 2015-05-24 | Disposition: A | Discharge status: Home / Self Care | Payer: Medicaid Other | Source: Ambulatory Visit | Attending: Obstetrics and Gynecology | Admitting: Obstetrics and Gynecology

## 2015-05-24 DIAGNOSIS — Z3490 Encounter for supervision of normal pregnancy, unspecified, unspecified trimester: Secondary | ICD-10-CM

## 2015-05-24 DIAGNOSIS — F1190 Opioid use, unspecified, uncomplicated: Secondary | ICD-10-CM

## 2015-05-24 DIAGNOSIS — K219 Gastro-esophageal reflux disease without esophagitis: Secondary | ICD-10-CM | POA: Diagnosis not present

## 2015-05-24 DIAGNOSIS — O99333 Smoking (tobacco) complicating pregnancy, third trimester: Secondary | ICD-10-CM | POA: Diagnosis not present

## 2015-05-24 DIAGNOSIS — Z3A34 34 weeks gestation of pregnancy: Secondary | ICD-10-CM | POA: Insufficient documentation

## 2015-05-24 DIAGNOSIS — R768 Other specified abnormal immunological findings in serum: Secondary | ICD-10-CM

## 2015-05-24 DIAGNOSIS — O0933 Supervision of pregnancy with insufficient antenatal care, third trimester: Secondary | ICD-10-CM | POA: Diagnosis present

## 2015-05-24 DIAGNOSIS — O99323 Drug use complicating pregnancy, third trimester: Secondary | ICD-10-CM

## 2015-05-24 LAB — RAPID URINE DRUG SCREEN, HOSP PERFORMED
AMPHETAMINES: NOT DETECTED
Barbiturates: NOT DETECTED
Benzodiazepines: NOT DETECTED
Cocaine: NOT DETECTED
OPIATES: POSITIVE — AB
Tetrahydrocannabinol: NOT DETECTED

## 2015-05-24 LAB — RAPID HIV SCREEN (HIV 1/2 AB+AG)
HIV 1/2 Antibodies: NONREACTIVE
HIV-1 P24 ANTIGEN - HIV24: NONREACTIVE

## 2015-05-24 LAB — TYPE AND SCREEN
ABO/RH(D): O POS
Antibody Screen: NEGATIVE

## 2015-05-24 LAB — URINALYSIS, ROUTINE W REFLEX MICROSCOPIC
BILIRUBIN URINE: NEGATIVE
Glucose, UA: NEGATIVE mg/dL
HGB URINE DIPSTICK: NEGATIVE
Ketones, ur: NEGATIVE mg/dL
NITRITE: NEGATIVE
PROTEIN: NEGATIVE mg/dL
SPECIFIC GRAVITY, URINE: 1.01 (ref 1.005–1.030)
pH: 7.5 (ref 5.0–8.0)

## 2015-05-24 LAB — URINE MICROSCOPIC-ADD ON

## 2015-05-24 LAB — CBC
HEMATOCRIT: 35.9 % — AB (ref 36.0–46.0)
HEMOGLOBIN: 12.1 g/dL (ref 12.0–15.0)
MCH: 29.2 pg (ref 26.0–34.0)
MCHC: 33.7 g/dL (ref 30.0–36.0)
MCV: 86.5 fL (ref 78.0–100.0)
Platelets: 277 10*3/uL (ref 150–400)
RBC: 4.15 MIL/uL (ref 3.87–5.11)
RDW: 14.6 % (ref 11.5–15.5)
WBC: 9.7 10*3/uL (ref 4.0–10.5)

## 2015-05-24 LAB — HEPATITIS B SURFACE ANTIGEN: HEP B S AG: NEGATIVE

## 2015-05-24 LAB — ABO/RH: ABO/RH(D): O POS

## 2015-05-24 NOTE — Discharge Instructions (Signed)
Prenatal Care °WHAT IS PRENATAL CARE?  °Prenatal care is the process of caring for a pregnant woman before she gives birth. Prenatal care makes sure that she and her baby remain as healthy as possible throughout pregnancy. Prenatal care may be provided by a midwife, family practice health care provider, or a childbirth and pregnancy specialist (obstetrician). Prenatal care may include physical examinations, testing, treatments, and education on nutrition, lifestyle, and social support services. °WHY IS PRENATAL CARE SO IMPORTANT?  °Early and consistent prenatal care increases the chance that you and your baby will remain healthy throughout your pregnancy. This type of care also decreases a baby's risk of being born too early (prematurely), or being born smaller than expected (small for gestational age). Any underlying medical conditions you may have that could pose a risk during your pregnancy are discussed during prenatal care visits. You will also be monitored regularly for any new conditions that may arise during your pregnancy so they can be treated quickly and effectively. °WHAT HAPPENS DURING PRENATAL CARE VISITS? °Prenatal care visits may include the following: °Discussion °Tell your health care provider about any new signs or symptoms you have experienced since your last visit. These might include: °· Nausea or vomiting. °· Increased or decreased level of energy. °· Difficulty sleeping. °· Back or leg pain. °· Weight changes. °· Frequent urination. °· Shortness of breath with physical activity. °· Changes in your skin, such as the development of a rash or itchiness. °· Vaginal discharge or bleeding. °· Feelings of excitement or nervousness. °· Changes in your baby's movements. °You may want to write down any questions or topics you want to discuss with your health care provider and bring them with you to your appointment. °Examination °During your first prenatal care visit, you will likely have a complete  physical exam. Your health care provider will often examine your vagina, cervix, and the position of your uterus, as well as check your heart, lungs, and other body systems. As your pregnancy progresses, your health care provider will measure the size of your uterus and your baby's position inside your uterus. He or she may also examine you for early signs of labor. Your prenatal visits may also include checking your blood pressure and, after about 10-12 weeks of pregnancy, listening to your baby's heartbeat. °Testing °Regular testing often includes: °· Urinalysis. This checks your urine for glucose, protein, or signs of infection. °· Blood count. This checks the levels of white and red blood cells in your body. °· Tests for sexually transmitted infections (STIs). Testing for STIs at the beginning of pregnancy is routinely done and is required in many states. °· Antibody testing. You will be checked to see if you are immune to certain illnesses, such as rubella, that can affect a developing fetus. °· Glucose screen. Around 24-28 weeks of pregnancy, your blood glucose level will be checked for signs of gestational diabetes. Follow-up tests may be recommended. °· Group B strep. This is a bacteria that is commonly found inside a woman's vagina. This test will inform your health care provider if you need an antibiotic to reduce the amount of this bacteria in your body prior to labor and childbirth. °· Ultrasound. Many pregnant women undergo an ultrasound screening around 18-20 weeks of pregnancy to evaluate the health of the fetus and check for any developmental abnormalities. °· HIV (human immunodeficiency virus) testing. Early in your pregnancy, you will be screened for HIV. If you are at high risk for HIV, this test   may be repeated during your third trimester of pregnancy. °You may be offered other testing based on your age, personal or family medical history, or other factors.  °HOW OFTEN SHOULD I PLAN TO SEE MY  HEALTH CARE PROVIDER FOR PRENATAL CARE? °Your prenatal care check-up schedule depends on any medical conditions you have before, or develop during, your pregnancy. If you do not have any underlying medical conditions, you will likely be seen for checkups: °· Monthly, during the first 6 months of pregnancy. °· Twice a month during months 7 and 8 of pregnancy. °· Weekly starting in the 9th month of pregnancy and until delivery. °If you develop signs of early labor or other concerning signs or symptoms, you may need to see your health care provider more often. Ask your health care provider what prenatal care schedule is best for you. °WHAT CAN I DO TO KEEP MYSELF AND MY BABY AS HEALTHY AS POSSIBLE DURING MY PREGNANCY? °· Take a prenatal vitamin containing 400 micrograms (0.4 mg) of folic acid every day. Your health care provider may also ask you to take additional vitamins such as iodine, vitamin D, iron, copper, and zinc. °· Take 1500-2000 mg of calcium daily starting at your 20th week of pregnancy until you deliver your baby. °· Make sure you are up to date on your vaccinations. Unless directed otherwise by your health care provider: °¨ You should receive a tetanus, diphtheria, and pertussis (Tdap) vaccination between the 27th and 36th week of your pregnancy, regardless of when your last Tdap immunization occurred. This helps protect your baby from whooping cough (pertussis) after he or she is born. °¨ You should receive an annual inactivated influenza vaccine (IIV) to help protect you and your baby from influenza. This can be done at any point during your pregnancy. °· Eat a well-rounded diet that includes: °¨ Fresh fruits and vegetables. °¨ Lean proteins. °¨ Calcium-rich foods such as milk, yogurt, hard cheeses, and dark, leafy greens. °¨ Whole grain breads. °· Do not eat seafood high in mercury, including: °¨ Swordfish. °¨ Tilefish. °¨ Shark. °¨ King mackerel. °¨ More than 6 oz tuna per week. °· Do not eat: °¨ Raw  or undercooked meats or eggs. °¨ Unpasteurized foods, such as soft cheeses (brie, blue, or feta), juices, and milks. °¨ Lunch meats. °¨ Hot dogs that have not been heated until they are steaming. °· Drink enough water to keep your urine clear or pale yellow. For many women, this may be 10 or more 8 oz glasses of water each day. Keeping yourself hydrated helps deliver nutrients to your baby and may prevent the start of pre-term uterine contractions. °· Do not use any tobacco products including cigarettes, chewing tobacco, or electronic cigarettes. If you need help quitting, ask your health care provider. °· Do not drink beverages containing alcohol. No safe level of alcohol consumption during pregnancy has been determined. °· Do not use any illegal drugs. These can harm your developing baby or cause a miscarriage. °· Ask your health care provider or pharmacist before taking any prescription or over-the-counter medicines, herbs, or supplements. °· Limit your caffeine intake to no more than 200 mg per day. °· Exercise. Unless told otherwise by your health care provider, try to get 30 minutes of moderate exercise most days of the week. Do not  do high-impact activities, contact sports, or activities with a high risk of falling, such as horseback riding or downhill skiing. °· Get plenty of rest. °· Avoid anything that raises your   body temperature, such as hot tubs and saunas. °· If you own a cat, do not empty its litter box. Bacteria contained in cat feces can cause an infection called toxoplasmosis. This can result in serious harm to the fetus. °· Stay away from chemicals such as insecticides, lead, mercury, and cleaning or paint products that contain solvents. °· Do not have any X-rays taken unless medically necessary. °· Take a childbirth and breastfeeding preparation class. Ask your health care provider if you need a referral or recommendation. °  °This information is not intended to replace advice given to you by  your health care provider. Make sure you discuss any questions you have with your health care provider. °  °Document Released: 03/25/2003 Document Revised: 04/12/2014 Document Reviewed: 06/06/2013 °Elsevier Interactive Patient Education ©2016 Elsevier Inc. ° °

## 2015-05-24 NOTE — MAU Provider Note (Signed)
MAU HISTORY AND PHYSICAL  Chief Complaint:  No prenatal care  [name] [name] is a 35 y.o.  G2P0101 with IUP at [redacted]w[redacted]d presenting for the above. No prenatal care this pregnancy, says recent has called around to get seen but no one will see her. Recent history IV heroin use. No bleeding, leakage of fluid, or contractions. Sure reliable LMP per patient.  Past Medical History  Diagnosis Date  . GERD (gastroesophageal reflux disease)   . Substance abuse     Past Surgical History  Procedure Laterality Date  . I&d extremity  08/20/2011    Procedure: IRRIGATION AND DEBRIDEMENT EXTREMITY;  Surgeon: Tami Ribas, MD;  Location: [facility] OR;  Service: Orthopedics;  Laterality: Right;  . Hand surgery      hand infection  . I&d extremity  11/04/2011    Procedure: IRRIGATION AND DEBRIDEMENT EXTREMITY;  Surgeon: Sharma Covert, MD;  Location: [facility] OR;  Service: Orthopedics;  Laterality: Left;  I & D ledft hand    Family History  Problem Relation Age of Onset  . Cancer Mother   . Diabetes Mother   . Hyperlipidemia Mother   . Hypertension Father   . Hyperlipidemia Father     Social History  Substance Use Topics  . Smoking status: Current Every Day Smoker -- 1.00 packs/day for 13 years    Types: Cigarettes  . Smokeless tobacco: Never Used  . Alcohol Use: No    No Known Allergies  No prescriptions prior to admission    Review of Systems - Negative except for what is mentioned in HPI.  Physical Exam  Blood pressure 115/72, pulse 85, temperature 98.2 F (36.8 C), temperature source Oral, resp. rate 16, last menstrual period 09/24/2014. GENERAL: Well-developed, well-nourished female in no acute distress.  LUNGS: Clear to auscultation bilaterally.  HEART: Regular rate and rhythm. ABDOMEN: Soft, nontender, nondistended, gravid.  EXTREMITIES: Nontender, no edema, 2+ distal pulses. FHT: 135/mod/+a/-d   Labs: Results for orders placed or performed during the hospital encounter of 05/24/15 (from  the past 24 hour(s))  Urinalysis, Routine w reflex microscopic (not at [facility])   Collection Time: 05/24/15 12:43 PM  Result Value Ref Range   Color, Urine YELLOW YELLOW   APPearance CLEAR CLEAR   Specific Gravity, Urine 1.010 1.005 - 1.030   pH 7.5 5.0 - 8.0   Glucose, UA NEGATIVE NEGATIVE mg/dL   Hgb urine dipstick NEGATIVE NEGATIVE   Bilirubin Urine NEGATIVE NEGATIVE   Ketones, ur NEGATIVE NEGATIVE mg/dL   Protein, ur NEGATIVE NEGATIVE mg/dL   Nitrite NEGATIVE NEGATIVE   Leukocytes, UA SMALL (A) NEGATIVE  Urine microscopic-add on   Collection Time: 05/24/15 12:43 PM  Result Value Ref Range   Squamous Epithelial / LPF 0-5 (A) NONE SEEN   WBC, UA 0-5 0 - 5 WBC/hpf   RBC / HPF 0-5 0 - 5 RBC/hpf   Bacteria, UA FEW (A) NONE SEEN  CBC   Collection Time: 05/24/15  2:04 PM  Result Value Ref Range   WBC 9.7 4.0 - 10.5 K/uL   RBC 4.15 3.87 - 5.11 MIL/uL   Hemoglobin 12.1 12.0 - 15.0 g/dL   HCT 16.1 (L) 09.6 - 04.5 %   MCV 86.5 78.0 - 100.0 fL   MCH 29.2 26.0 - 34.0 pg   MCHC 33.7 30.0 - 36.0 g/dL   RDW 40.9 81.1 - 91.4 %   Platelets 277 150 - 400 K/uL    Imaging Studies:  No results found.  Assessment: [name] Shiner  A [name] is  35 y.o. G2P0101 at [redacted]w[redacted]d presents with no prenatal care. No real complaint, just wanting to "check on the baby since I haven't seen a doctor yet." NST reactive, Leopold's consistent with dating.  Plan: - prenatal labs drawn - ordering outpatient OB ultrasound  - start prenatal vitamin - social work saw patient and referred to treatment center(s) for methadone or subutex initiation - high-risk ob f/u asap, sending message to our admin staff to schedule  Silvano Bilis 2/18/20172:51 PM

## 2015-05-24 NOTE — MAU Note (Signed)
G2P1; no prenatal care; by LNMP- pt is [redacted]w[redacted]d; pt states that she wants to get an u/s to "check on the baby";

## 2015-05-25 LAB — COMMENT2 - HEP PANEL

## 2015-05-25 LAB — RUBELLA SCREEN: RUBELLA: 1.01 {index} (ref >0.99)

## 2015-05-25 LAB — HIV ANTIBODY (ROUTINE TESTING W REFLEX): HIV SCREEN 4TH GENERATION: NONREACTIVE

## 2015-05-25 LAB — RPR: RPR: NONREACTIVE

## 2015-05-25 LAB — HEPATITIS C ANTIBODY (REFLEX)

## 2015-05-26 DIAGNOSIS — R768 Other specified abnormal immunological findings in serum: Secondary | ICD-10-CM

## 2015-05-26 LAB — CULTURE, OB URINE: Culture: >=100000

## 2015-05-26 LAB — GC/CHLAMYDIA PROBE AMP (~~LOC~~) NOT AT ARMC
Chlamydia: NEGATIVE
NEISSERIA GONORRHEA: NEGATIVE

## 2015-05-26 LAB — CULTURE, BETA STREP (GROUP B ONLY)

## 2015-05-27 ENCOUNTER — Telehealth: Payer: Self-pay | Admitting: *Deleted

## 2015-05-27 NOTE — Telephone Encounter (Signed)
Per Dr Ashok Pall, patient seen in [facility], no prenatal care. Test for hepatitis C +. Called patient to inform her of the results and to schedule new ob appointment asap. Spoke with patient, she was unaware of her hep c status and was willing to schedule an appointment. She is scheduled for 2/23 @ 0945. Encouraged her to come to this appointment and if she could not then call to reschedule if needed. Patient voiced understanding.

## 2015-05-29 ENCOUNTER — Encounter: Payer: Medicaid Other | Admitting: Obstetrics & Gynecology

## 2015-06-02 ENCOUNTER — Ambulatory Visit (INDEPENDENT_AMBULATORY_CARE_PROVIDER_SITE_OTHER): Payer: Medicaid Other | Admitting: Obstetrics and Gynecology

## 2015-06-02 ENCOUNTER — Other Ambulatory Visit: Payer: Self-pay | Admitting: Obstetrics and Gynecology

## 2015-06-02 VITALS — BP 107/71 | HR 82 | Temp 98.0°F | Wt 173.2 lb

## 2015-06-02 DIAGNOSIS — O0993 Supervision of high risk pregnancy, unspecified, third trimester: Secondary | ICD-10-CM

## 2015-06-02 DIAGNOSIS — O0933 Supervision of pregnancy with insufficient antenatal care, third trimester: Secondary | ICD-10-CM | POA: Diagnosis not present

## 2015-06-02 DIAGNOSIS — O09213 Supervision of pregnancy with history of pre-term labor, third trimester: Secondary | ICD-10-CM

## 2015-06-02 DIAGNOSIS — Z113 Encounter for screening for infections with a predominantly sexual mode of transmission: Secondary | ICD-10-CM

## 2015-06-02 LAB — POCT URINALYSIS DIP (DEVICE)
BILIRUBIN URINE: NEGATIVE
Glucose, UA: NEGATIVE mg/dL
HGB URINE DIPSTICK: NEGATIVE
Ketones, ur: NEGATIVE mg/dL
NITRITE: NEGATIVE
PH: 7 (ref 5.0–8.0)
PROTEIN: NEGATIVE mg/dL
Specific Gravity, Urine: 1.015 (ref 1.005–1.030)
UROBILINOGEN UA: 0.2 mg/dL (ref 0.0–1.0)

## 2015-06-02 NOTE — Patient Instructions (Signed)
Third Trimester of Pregnancy The third trimester is from week 29 through week 42, months 7 through 9. The third trimester is a time when the fetus is growing rapidly. At the end of the ninth month, the fetus is about 20 inches in length and weighs 6-10 pounds.  BODY CHANGES Your body goes through many changes during pregnancy. The changes vary from woman to woman.   Your weight will continue to increase. You can expect to gain 25-35 pounds (11-16 kg) by the end of the pregnancy.  You may begin to get stretch marks on your hips, abdomen, and breasts.  You may urinate more often because the fetus is moving lower into your pelvis and pressing on your bladder.  You may develop or continue to have heartburn as a result of your pregnancy.  You may develop constipation because certain hormones are causing the muscles that push waste through your intestines to slow down.  You may develop hemorrhoids or swollen, bulging veins (varicose veins).  You may have pelvic pain because of the weight gain and pregnancy hormones relaxing your joints between the bones in your pelvis. Backaches may result from overexertion of the muscles supporting your posture.  You may have changes in your hair. These can include thickening of your hair, rapid growth, and changes in texture. Some women also have hair loss during or after pregnancy, or hair that feels dry or thin. Your hair will most likely return to normal after your baby is born.  Your breasts will continue to grow and be tender. A yellow discharge may leak from your breasts called colostrum.  Your belly button may stick out.  You may feel short of breath because of your expanding uterus.  You may notice the fetus "dropping," or moving lower in your abdomen.  You may have a bloody mucus discharge. This usually occurs a few days to a week before labor begins.  Your cervix becomes thin and soft (effaced) near your due date. WHAT TO EXPECT AT YOUR  PRENATAL EXAMS  You will have prenatal exams every 2 weeks until week 36. Then, you will have weekly prenatal exams. During a routine prenatal visit:  You will be weighed to make sure you and the fetus are growing normally.  Your blood pressure is taken.  Your abdomen will be measured to track your baby's growth.  The fetal heartbeat will be listened to.  Any test results from the previous visit will be discussed.  You may have a cervical check near your due date to see if you have effaced. At around 36 weeks, your caregiver will check your cervix. At the same time, your caregiver will also perform a test on the secretions of the vaginal tissue. This test is to determine if a type of bacteria, Group B streptococcus, is present. Your caregiver will explain this further. Your caregiver may ask you:  What your birth plan is.  How you are feeling.  If you are feeling the baby move.  If you have had any abnormal symptoms, such as leaking fluid, bleeding, severe headaches, or abdominal cramping.  If you are using any tobacco products, including cigarettes, chewing tobacco, and electronic cigarettes.  If you have any questions. Other tests or screenings that may be performed during your third trimester include:  Blood tests that check for low iron levels (anemia).  Fetal testing to check the health, activity level, and growth of the fetus. Testing is done if you have certain medical conditions or if   there are problems during the pregnancy.  HIV (human immunodeficiency virus) testing. If you are at high risk, you may be screened for HIV during your third trimester of pregnancy. FALSE LABOR You may feel small, irregular contractions that eventually go away. These are called Braxton Hicks contractions, or false labor. Contractions may last for hours, days, or even weeks before true labor sets in. If contractions come at regular intervals, intensify, or become painful, it is best to be seen  by your caregiver.  SIGNS OF LABOR   Menstrual-like cramps.  Contractions that are 5 minutes apart or less.  Contractions that start on the top of the uterus and spread down to the lower abdomen and back.  A sense of increased pelvic pressure or back pain.  A watery or bloody mucus discharge that comes from the vagina. If you have any of these signs before the 37th week of pregnancy, call your caregiver right away. You need to go to the hospital to get checked immediately. HOME CARE INSTRUCTIONS   Avoid all smoking, herbs, alcohol, and unprescribed drugs. These chemicals affect the formation and growth of the baby.  Do not use any tobacco products, including cigarettes, chewing tobacco, and electronic cigarettes. If you need help quitting, ask your health care provider. You may receive counseling support and other resources to help you quit.  Follow your caregiver's instructions regarding medicine use. There are medicines that are either safe or unsafe to take during pregnancy.  Exercise only as directed by your caregiver. Experiencing uterine cramps is a good sign to stop exercising.  Continue to eat regular, healthy meals.  Wear a good support bra for breast tenderness.  Do not use hot tubs, steam rooms, or saunas.  Wear your seat belt at all times when driving.  Avoid raw meat, uncooked cheese, cat litter boxes, and soil used by cats. These carry germs that can cause birth defects in the baby.  Take your prenatal vitamins.  Take 1500-2000 mg of calcium daily starting at the 20th week of pregnancy until you deliver your baby.  Try taking a stool softener (if your caregiver approves) if you develop constipation. Eat more high-fiber foods, such as fresh vegetables or fruit and whole grains. Drink plenty of fluids to keep your urine clear or pale yellow.  Take warm sitz baths to soothe any pain or discomfort caused by hemorrhoids. Use hemorrhoid cream if your caregiver  approves.  If you develop varicose veins, wear support hose. Elevate your feet for 15 minutes, 3-4 times a day. Limit salt in your diet.  Avoid heavy lifting, wear low heal shoes, and practice good posture.  Rest a lot with your legs elevated if you have leg cramps or low back pain.  Visit your dentist if you have not gone during your pregnancy. Use a soft toothbrush to brush your teeth and be gentle when you floss.  A sexual relationship may be continued unless your caregiver directs you otherwise.  Do not travel far distances unless it is absolutely necessary and only with the approval of your caregiver.  Take prenatal classes to understand, practice, and ask questions about the labor and delivery.  Make a trial run to the hospital.  Pack your hospital bag.  Prepare the baby's nursery.  Continue to go to all your prenatal visits as directed by your caregiver. SEEK MEDICAL CARE IF:  You are unsure if you are in labor or if your water has broken.  You have dizziness.  You have   mild pelvic cramps, pelvic pressure, or nagging pain in your abdominal area.  You have persistent nausea, vomiting, or diarrhea.  You have a bad smelling vaginal discharge.  You have pain with urination. SEEK IMMEDIATE MEDICAL CARE IF:   You have a fever.  You are leaking fluid from your vagina.  You have spotting or bleeding from your vagina.  You have severe abdominal cramping or pain.  You have rapid weight loss or gain.  You have shortness of breath with chest pain.  You notice sudden or extreme swelling of your face, hands, ankles, feet, or legs.  You have not felt your baby move in over an hour.  You have severe headaches that do not go away with medicine.  You have vision changes.   This information is not intended to replace advice given to you by your health care provider. Make sure you discuss any questions you have with your health care provider.   Document Released:  03/16/2001 Document Revised: 04/12/2014 Document Reviewed: 05/23/2012 Elsevier Interactive Patient Education 2016 [employer]  Contraception Choices Contraception (birth control) is the use of any methods or devices to prevent pregnancy. Below are some methods to help avoid pregnancy. HORMONAL METHODS   Contraceptive implant. This is a thin, plastic tube containing progesterone hormone. It does not contain estrogen hormone. Your health care provider inserts the tube in the inner part of the upper arm. The tube can remain in place for up to 3 years. After 3 years, the implant must be removed. The implant prevents the ovaries from releasing an egg (ovulation), thickens the cervical mucus to prevent sperm from entering the uterus, and thins the lining of the inside of the uterus.  Progesterone-only injections. These injections are given every 3 months by your health care provider to prevent pregnancy. This synthetic progesterone hormone stops the ovaries from releasing eggs. It also thickens cervical mucus and changes the uterine lining. This makes it harder for sperm to survive in the uterus.  Birth control pills. These pills contain estrogen and progesterone hormone. They work by preventing the ovaries from releasing eggs (ovulation). They also cause the cervical mucus to thicken, preventing the sperm from entering the uterus. Birth control pills are prescribed by a health care provider.Birth control pills can also be used to treat heavy periods.  Minipill. This type of birth control pill contains only the progesterone hormone. They are taken every day of each month and must be prescribed by your health care provider.  Birth control patch. The patch contains hormones similar to those in birth control pills. It must be changed once a week and is prescribed by a health care provider.  Vaginal ring. The ring contains hormones similar to those in birth control pills. It is left in the vagina for 3  weeks, removed for 1 week, and then a new one is put back in place. The patient must be comfortable inserting and removing the ring from the vagina.A health care provider's prescription is necessary.  Emergency contraception. Emergency contraceptives prevent pregnancy after unprotected sexual intercourse. This pill can be taken right after sex or up to 5 days after unprotected sex. It is most effective the sooner you take the pills after having sexual intercourse. Most emergency contraceptive pills are available without a prescription. Check with your pharmacist. Do not use emergency contraception as your only form of birth control. BARRIER METHODS   Female condom. This is a thin sheath (latex or rubber) that is worn over the penis   during sexual intercourse. It can be used with spermicide to increase effectiveness.  Female condom. This is a soft, loose-fitting sheath that is put into the vagina before sexual intercourse.  Diaphragm. This is a soft, latex, dome-shaped barrier that must be fitted by a health care provider. It is inserted into the vagina, along with a spermicidal jelly. It is inserted before intercourse. The diaphragm should be left in the vagina for 6 to 8 hours after intercourse.  Cervical cap. This is a round, soft, latex or plastic cup that fits over the cervix and must be fitted by a health care provider. The cap can be left in place for up to 48 hours after intercourse.  Sponge. This is a soft, circular piece of polyurethane foam. The sponge has spermicide in it. It is inserted into the vagina after wetting it and before sexual intercourse.  Spermicides. These are chemicals that kill or block sperm from entering the cervix and uterus. They come in the form of creams, jellies, suppositories, foam, or tablets. They do not require a prescription. They are inserted into the vagina with an applicator before having sexual intercourse. The process must be repeated every time you have  sexual intercourse. INTRAUTERINE CONTRACEPTION  Intrauterine device (IUD). This is a T-shaped device that is put in a woman's uterus during a menstrual period to prevent pregnancy. There are 2 types:  Copper IUD. This type of IUD is wrapped in copper wire and is placed inside the uterus. Copper makes the uterus and fallopian tubes produce a fluid that kills sperm. It can stay in place for 10 years.  Hormone IUD. This type of IUD contains the hormone progestin (synthetic progesterone). The hormone thickens the cervical mucus and prevents sperm from entering the uterus, and it also thins the uterine lining to prevent implantation of a fertilized egg. The hormone can weaken or kill the sperm that get into the uterus. It can stay in place for 3-5 years, depending on which type of IUD is used. PERMANENT METHODS OF CONTRACEPTION  Female tubal ligation. This is when the woman's fallopian tubes are surgically sealed, tied, or blocked to prevent the egg from traveling to the uterus.  Hysteroscopic sterilization. This involves placing a small coil or insert into each fallopian tube. Your doctor uses a technique called hysteroscopy to do the procedure. The device causes scar tissue to form. This results in permanent blockage of the fallopian tubes, so the sperm cannot fertilize the egg. It takes about 3 months after the procedure for the tubes to become blocked. You must use another form of birth control for these 3 months.  Female sterilization. This is when the female has the tubes that carry sperm tied off (vasectomy).This blocks sperm from entering the vagina during sexual intercourse. After the procedure, the man can still ejaculate fluid (semen). NATURAL PLANNING METHODS  Natural family planning. This is not having sexual intercourse or using a barrier method (condom, diaphragm, cervical cap) on days the woman could become pregnant.  Calendar method. This is keeping track of the length of each menstrual  cycle and identifying when you are fertile.  Ovulation method. This is avoiding sexual intercourse during ovulation.  Symptothermal method. This is avoiding sexual intercourse during ovulation, using a thermometer and ovulation symptoms.  Post-ovulation method. This is timing sexual intercourse after you have ovulated. Regardless of which type or method of contraception you choose, it is important that you use condoms to protect against the transmission of sexually transmitted infections (  STIs). Talk with your health care provider about which form of contraception is most appropriate for you.   This information is not intended to replace advice given to you by your health care provider. Make sure you discuss any questions you have with your health care provider.   Document Released: 03/22/2005 Document Revised: 03/27/2013 Document Reviewed: 09/14/2012 Elsevier Interactive Patient Education 2016 [employer]   Breastfeeding Deciding to breastfeed is one of the best choices you can make for you and your baby. A change in hormones during pregnancy causes your breast tissue to grow and increases the number and size of your milk ducts. These hormones also allow proteins, sugars, and fats from your blood supply to make breast milk in your milk-producing glands. Hormones prevent breast milk from being released before your baby is born as well as prompt milk flow after birth. Once breastfeeding has begun, thoughts of your baby, as well as his or her sucking or crying, can stimulate the release of milk from your milk-producing glands.  BENEFITS OF BREASTFEEDING For Your Baby  Your first milk (colostrum) helps your baby's digestive system function better.  There are antibodies in your milk that help your baby fight off infections.  Your baby has a lower incidence of asthma, allergies, and sudden infant death syndrome.  The nutrients in breast milk are better for your baby than infant formulas and  are designed uniquely for your baby's needs.  Breast milk improves your baby's brain development.  Your baby is less likely to develop other conditions, such as childhood obesity, asthma, or type 2 diabetes mellitus. For You  Breastfeeding helps to create a very special bond between you and your baby.  Breastfeeding is convenient. Breast milk is always available at the correct temperature and costs nothing.  Breastfeeding helps to burn calories and helps you lose the weight gained during pregnancy.  Breastfeeding makes your uterus contract to its prepregnancy size faster and slows bleeding (lochia) after you give birth.   Breastfeeding helps to lower your risk of developing type 2 diabetes mellitus, osteoporosis, and breast or ovarian cancer later in life. SIGNS THAT YOUR BABY IS HUNGRY Early Signs of Hunger  Increased alertness or activity.  Stretching.  Movement of the head from side to side.  Movement of the head and opening of the mouth when the corner of the mouth or cheek is stroked (rooting).  Increased sucking sounds, smacking lips, cooing, sighing, or squeaking.  Hand-to-mouth movements.  Increased sucking of fingers or hands. Late Signs of Hunger  Fussing.  Intermittent crying. Extreme Signs of Hunger Signs of extreme hunger will require calming and consoling before your baby will be able to breastfeed successfully. Do not wait for the following signs of extreme hunger to occur before you initiate breastfeeding:  Restlessness.  A loud, strong cry.  Screaming. BREASTFEEDING BASICS Breastfeeding Initiation  Find a comfortable place to sit or lie down, with your neck and back well supported.  Place a pillow or rolled up blanket under your baby to bring him or her to the level of your breast (if you are seated). Nursing pillows are specially designed to help support your arms and your baby while you breastfeed.  Make sure that your baby's abdomen is facing  your abdomen.  Gently massage your breast. With your fingertips, massage from your chest wall toward your nipple in a circular motion. This encourages milk flow. You may need to continue this action during the feeding if your milk flows slowly.    Support your breast with 4 fingers underneath and your thumb above your nipple. Make sure your fingers are well away from your nipple and your baby's mouth.  Stroke your baby's lips gently with your finger or nipple.  When your baby's mouth is open wide enough, quickly bring your baby to your breast, placing your entire nipple and as much of the colored area around your nipple (areola) as possible into your baby's mouth.  More areola should be visible above your baby's upper lip than below the lower lip.  Your baby's tongue should be between his or her lower gum and your breast.  Ensure that your baby's mouth is correctly positioned around your nipple (latched). Your baby's lips should create a seal on your breast and be turned out (everted).  It is common for your baby to suck about 2-3 minutes in order to start the flow of breast milk. Latching Teaching your baby how to latch on to your breast properly is very important. An improper latch can cause nipple pain and decreased milk supply for you and poor weight gain in your baby. Also, if your baby is not latched onto your nipple properly, he or she may swallow some air during feeding. This can make your baby fussy. Burping your baby when you switch breasts during the feeding can help to get rid of the air. However, teaching your baby to latch on properly is still the best way to prevent fussiness from swallowing air while breastfeeding. Signs that your baby has successfully latched on to your nipple:  Silent tugging or silent sucking, without causing you pain.  Swallowing heard between every 3-4 sucks.  Muscle movement above and in front of his or her ears while sucking. Signs that your baby has  not successfully latched on to nipple:  Sucking sounds or smacking sounds from your baby while breastfeeding.  Nipple pain. If you think your baby has not latched on correctly, slip your finger into the corner of your baby's mouth to break the suction and place it between your baby's gums. Attempt breastfeeding initiation again. Signs of Successful Breastfeeding Signs from your baby:  A gradual decrease in the number of sucks or complete cessation of sucking.  Falling asleep.  Relaxation of his or her body.  Retention of a small amount of milk in his or her mouth.  Letting go of your breast by himself or herself. Signs from you:  Breasts that have increased in firmness, weight, and size 1-3 hours after feeding.  Breasts that are softer immediately after breastfeeding.  Increased milk volume, as well as a change in milk consistency and color by the fifth day of breastfeeding.  Nipples that are not sore, cracked, or bleeding. Signs That Your Baby is Getting Enough Milk  Wetting at least 3 diapers in a 24-hour period. The urine should be clear and pale yellow by age 5 days.  At least 3 stools in a 24-hour period by age 5 days. The stool should be soft and yellow.  At least 3 stools in a 24-hour period by age 7 days. The stool should be seedy and yellow.  No loss of weight greater than 10% of birth weight during the first 3 days of age.  Average weight gain of 4-7 ounces (113-198 g) per week after age 4 days.  Consistent daily weight gain by age 5 days, without weight loss after the age of 2 weeks. After a feeding, your baby may spit up a small amount. This   is common. BREASTFEEDING FREQUENCY AND DURATION Frequent feeding will help you make more milk and can prevent sore nipples and breast engorgement. Breastfeed when you feel the need to reduce the fullness of your breasts or when your baby shows signs of hunger. This is called "breastfeeding on demand." Avoid introducing a  pacifier to your baby while you are working to establish breastfeeding (the first 4-6 weeks after your baby is born). After this time you may choose to use a pacifier. Research has shown that pacifier use during the first year of a baby's life decreases the risk of sudden infant death syndrome (SIDS). Allow your baby to feed on each breast as long as he or she wants. Breastfeed until your baby is finished feeding. When your baby unlatches or falls asleep while feeding from the first breast, offer the second breast. Because newborns are often sleepy in the first few weeks of life, you may need to awaken your baby to get him or her to feed. Breastfeeding times will vary from baby to baby. However, the following rules can serve as a guide to help you ensure that your baby is properly fed:  Newborns (babies 4 weeks of age or younger) may breastfeed every 1-3 hours.  Newborns should not go longer than 3 hours during the day or 5 hours during the night without breastfeeding.  You should breastfeed your baby a minimum of 8 times in a 24-hour period until you begin to introduce solid foods to your baby at around 6 months of age. BREAST MILK PUMPING Pumping and storing breast milk allows you to ensure that your baby is exclusively fed your breast milk, even at times when you are unable to breastfeed. This is especially important if you are going back to work while you are still breastfeeding or when you are not able to be present during feedings. Your lactation consultant can give you guidelines on how long it is safe to store breast milk. A breast pump is a machine that allows you to pump milk from your breast into a sterile bottle. The pumped breast milk can then be stored in a refrigerator or freezer. Some breast pumps are operated by hand, while others use electricity. Ask your lactation consultant which type will work best for you. Breast pumps can be purchased, but some hospitals and breastfeeding support  groups lease breast pumps on a monthly basis. A lactation consultant can teach you how to hand express breast milk, if you prefer not to use a pump. CARING FOR YOUR BREASTS WHILE YOU BREASTFEED Nipples can become dry, cracked, and sore while breastfeeding. The following recommendations can help keep your breasts moisturized and healthy:  Avoid using soap on your nipples.  Wear a supportive bra. Although not required, special nursing bras and tank tops are designed to allow access to your breasts for breastfeeding without taking off your entire bra or top. Avoid wearing underwire-style bras or extremely tight bras.  Air dry your nipples for 3-4minutes after each feeding.  Use only cotton bra pads to absorb leaked breast milk. Leaking of breast milk between feedings is normal.  Use lanolin on your nipples after breastfeeding. Lanolin helps to maintain your skin's normal moisture barrier. If you use pure lanolin, you do not need to wash it off before feeding your baby again. Pure lanolin is not toxic to your baby. You may also hand express a few drops of breast milk and gently massage that milk into your nipples and allow the milk   to air dry. In the first few weeks after giving birth, some women experience extremely full breasts (engorgement). Engorgement can make your breasts feel heavy, warm, and tender to the touch. Engorgement peaks within 3-5 days after you give birth. The following recommendations can help ease engorgement:  Completely empty your breasts while breastfeeding or pumping. You may want to start by applying warm, moist heat (in the shower or with warm water-soaked hand towels) just before feeding or pumping. This increases circulation and helps the milk flow. If your baby does not completely empty your breasts while breastfeeding, pump any extra milk after he or she is finished.  Wear a snug bra (nursing or regular) or tank top for 1-2 days to signal your body to slightly decrease  milk production.  Apply ice packs to your breasts, unless this is too uncomfortable for you.  Make sure that your baby is latched on and positioned properly while breastfeeding. If engorgement persists after 48 hours of following these recommendations, contact your health care provider or a lactation consultant. OVERALL HEALTH CARE RECOMMENDATIONS WHILE BREASTFEEDING  Eat healthy foods. Alternate between meals and snacks, eating 3 of each per day. Because what you eat affects your breast milk, some of the foods may make your baby more irritable than usual. Avoid eating these foods if you are sure that they are negatively affecting your baby.  Drink milk, fruit juice, and water to satisfy your thirst (about 10 glasses a day).  Rest often, relax, and continue to take your prenatal vitamins to prevent fatigue, stress, and anemia.  Continue breast self-awareness checks.  Avoid chewing and smoking tobacco. Chemicals from cigarettes that pass into breast milk and exposure to secondhand smoke may harm your baby.  Avoid alcohol and drug use, including marijuana. Some medicines that may be harmful to your baby can pass through breast milk. It is important to ask your health care provider before taking any medicine, including all over-the-counter and prescription medicine as well as vitamin and herbal supplements. It is possible to become pregnant while breastfeeding. If birth control is desired, ask your health care provider about options that will be safe for your baby. SEEK MEDICAL CARE IF:  You feel like you want to stop breastfeeding or have become frustrated with breastfeeding.  You have painful breasts or nipples.  Your nipples are cracked or bleeding.  Your breasts are red, tender, or warm.  You have a swollen area on either breast.  You have a fever or chills.  You have nausea or vomiting.  You have drainage other than breast milk from your nipples.  Your breasts do not become  full before feedings by the fifth day after you give birth.  You feel sad and depressed.  Your baby is too sleepy to eat well.  Your baby is having trouble sleeping.   Your baby is wetting less than 3 diapers in a 24-hour period.  Your baby has less than 3 stools in a 24-hour period.  Your baby's skin or the white part of his or her eyes becomes yellow.   Your baby is not gaining weight by 5 days of age. SEEK IMMEDIATE MEDICAL CARE IF:  Your baby is overly tired (lethargic) and does not want to wake up and feed.  Your baby develops an unexplained fever.   This information is not intended to replace advice given to you by your health care provider. Make sure you discuss any questions you have with your health care provider.     Document Released: 03/22/2005 Document Revised: 12/11/2014 Document Reviewed: 09/13/2012 Elsevier Interactive Patient Education 2016 Elsevier Inc.  

## 2015-06-02 NOTE — Addendum Note (Signed)
Addended by: Catalina Antigua on: 06/02/2015 10:53 AM   Modules accepted: Orders

## 2015-06-02 NOTE — Progress Notes (Signed)
1 hour due at 10:35

## 2015-06-02 NOTE — Progress Notes (Signed)
   Subjective:    [name] [name] is a [id number] [redacted]w[redacted]d being seen today for her first obstetrical visit.  Her obstetrical history is significant for late onset of prenatal care at [redacted]w[redacted]d, history of preterm labor at 29w, positive Hep C. Patient does not intend to breast feed. Pregnancy history fully reviewed. Patient reports being homeless up until recently. She is now living with someone who is supportive of her and willing to help care for the infant  Patient reports no complaints.  Filed Vitals:   06/02/15 0931  BP: 107/71  Pulse: 82  Temp: 98 F (36.7 C)  Weight: 173 lb 3.2 oz (78.563 kg)    HISTORY: OB History  Gravida Para Term Preterm AB SAB TAB Ectopic Multiple Living  # Outcome Date GA Lbr Len/2nd Weight Sex Delivery Anes PTL Lv  2 Current           1 Preterm              Past Medical History  Diagnosis Date  . GERD (gastroesophageal reflux disease)   . Substance abuse    Past Surgical History  Procedure Laterality Date  . I&d extremity  08/20/2011    Procedure: IRRIGATION AND DEBRIDEMENT EXTREMITY;  Surgeon: Tami Ribas, MD;  Location: [facility] OR;  Service: Orthopedics;  Laterality: Right;  . Hand surgery      hand infection  . I&d extremity  11/04/2011    Procedure: IRRIGATION AND DEBRIDEMENT EXTREMITY;  Surgeon: Sharma Covert, MD;  Location: [facility] OR;  Service: Orthopedics;  Laterality: Left;  I & D ledft hand   Family History  Problem Relation Age of Onset  . Cancer Mother   . Diabetes Mother   . Hyperlipidemia Mother   . Hypertension Father   . Hyperlipidemia Father      Exam    Uterus:     Pelvic Exam:    Perineum: Normal Perineum   Vulva: normal   Vagina:  normal mucosa, normal discharge   pH:    Cervix: Closed/thick   Adnexa: not evaluated   Bony Pelvis: gynecoid  System: Breast:  normal appearance, no masses or tenderness   Skin: normal coloration and turgor, no rashes    Neurologic: oriented, no focal deficits   Extremities:  normal strength, tone, and muscle mass   HEENT extra ocular movement intact   Mouth/Teeth mucous membranes moist, pharynx normal without lesions and dental hygiene poor   Neck supple and no masses   Cardiovascular: regular rate and rhythm   Respiratory:  chest clear, no wheezing, crepitations, rhonchi, normal symmetric air entry   Abdomen: Soft, gravid   Urinary:       Assessment:    Pregnancy: G2P0101 Patient Active Problem List   Diagnosis Date Noted  . Supervision of high risk pregnancy in third trimester 06/02/2015  . High risk pregnancy due to history of preterm labor in third trimester 06/02/2015  . Insufficient prenatal care in third trimester 06/02/2015  . Hepatitis C antibody test positive 05/26/2015        Plan:     Initial labs drawn. Prenatal vitamins. Problem list reviewed and updated. Genetic Screening discussed: too late.  Ultrasound discussed; fetal survey: ordered.  Follow up in 1 weeks. 50% of 30 min visit spent on counseling and coordination of care.     Kaydee Magel 06/02/2015

## 2015-06-03 ENCOUNTER — Other Ambulatory Visit: Payer: Self-pay | Admitting: Obstetrics and Gynecology

## 2015-06-03 ENCOUNTER — Ambulatory Visit (HOSPITAL_COMMUNITY)
Admission: RE | Admit: 2015-06-03 | Discharge: 2015-06-03 | Disposition: A | Discharge status: Home / Self Care | Payer: Medicaid Other | Source: Ambulatory Visit | Attending: Obstetrics and Gynecology | Admitting: Obstetrics and Gynecology

## 2015-06-03 DIAGNOSIS — O0933 Supervision of pregnancy with insufficient antenatal care, third trimester: Secondary | ICD-10-CM

## 2015-06-03 DIAGNOSIS — B182 Chronic viral hepatitis C: Secondary | ICD-10-CM | POA: Diagnosis not present

## 2015-06-03 DIAGNOSIS — O09893 Supervision of other high risk pregnancies, third trimester: Secondary | ICD-10-CM

## 2015-06-03 DIAGNOSIS — O0993 Supervision of high risk pregnancy, unspecified, third trimester: Secondary | ICD-10-CM

## 2015-06-03 DIAGNOSIS — O09213 Supervision of pregnancy with history of pre-term labor, third trimester: Secondary | ICD-10-CM

## 2015-06-03 DIAGNOSIS — O98419 Viral hepatitis complicating pregnancy, unspecified trimester: Secondary | ICD-10-CM

## 2015-06-03 DIAGNOSIS — O98413 Viral hepatitis complicating pregnancy, third trimester: Secondary | ICD-10-CM | POA: Diagnosis present

## 2015-06-03 DIAGNOSIS — B189 Chronic viral hepatitis, unspecified: Secondary | ICD-10-CM

## 2015-06-03 DIAGNOSIS — O99323 Drug use complicating pregnancy, third trimester: Secondary | ICD-10-CM | POA: Insufficient documentation

## 2015-06-03 DIAGNOSIS — Z3A36 36 weeks gestation of pregnancy: Secondary | ICD-10-CM | POA: Diagnosis not present

## 2015-06-03 LAB — PRENATAL PROFILE (SOLSTAS)
Antibody Screen: NEGATIVE
BASOS ABS: 0 10*3/uL (ref 0.0–0.1)
BASOS PCT: 0 % (ref 0–1)
Eosinophils Absolute: 0.1 10*3/uL (ref 0.0–0.7)
Eosinophils Relative: 1 % (ref 0–5)
HCT: 37.1 % (ref 36.0–46.0)
HEP B S AG: NEGATIVE
HIV 1&2 Ab, 4th Generation: NONREACTIVE
Hemoglobin: 12.2 g/dL (ref 12.0–15.0)
Lymphocytes Relative: 33 % (ref 12–46)
Lymphs Abs: 4.2 10*3/uL — ABNORMAL HIGH (ref 0.7–4.0)
MCH: 28.4 pg (ref 26.0–34.0)
MCHC: 32.9 g/dL (ref 30.0–36.0)
MCV: 86.5 fL (ref 78.0–100.0)
MONOS PCT: 4 % (ref 3–12)
MPV: 11.3 fL (ref 8.6–12.4)
Monocytes Absolute: 0.5 10*3/uL (ref 0.1–1.0)
NEUTROS ABS: 7.8 10*3/uL — AB (ref 1.7–7.7)
NEUTROS PCT: 62 % (ref 43–77)
PLATELETS: 310 10*3/uL (ref 150–400)
RBC: 4.29 MIL/uL (ref 3.87–5.11)
RDW: 14.6 % (ref 11.5–15.5)
RH TYPE: POSITIVE
Rubella: 0.88 Index (ref <0.90)
WBC: 12.6 10*3/uL — AB (ref 4.0–10.5)

## 2015-06-03 LAB — GLUCOSE TOLERANCE, 1 HOUR (50G) W/O FASTING: Glucose, 1 Hr, gestational: 126 mg/dL (ref <140)

## 2015-06-04 LAB — CULTURE, OB URINE: Colony Count: 45000

## 2015-06-06 LAB — HCV RNA, QUANT REAL-TIME PCR W/REFLEX
HCV RNA, PCR, QN (LOG): 2.61 {Log_IU}/mL — AB
HCV RNA, PCR, QN: 409 IU/mL — ABNORMAL HIGH

## 2015-06-06 LAB — OPIATES/OPIOIDS (LC/MS-MS)
Codeine Urine: NEGATIVE ng/mL (ref <50)
HYDROMORPHONE: NEGATIVE ng/mL (ref <50)
Hydrocodone: NEGATIVE ng/mL (ref <50)
MORPHINE: 80 ng/mL — AB (ref <50)
NORHYDROCODONE, UR: NEGATIVE ng/mL (ref <50)
NOROXYCODONE, UR: NEGATIVE ng/mL (ref <50)
OXYCODONE, UR: NEGATIVE ng/mL (ref <50)
Oxymorphone: NEGATIVE ng/mL (ref <50)

## 2015-06-06 LAB — BUPRENORPHINES (GC/LC/MS), URINE
BUPRENORPHINE (GC/LC/MS): 330.8 ng/mL — AB (ref <2)
NORBUPRENORPHINE: 896.1 ng/mL — AB (ref <2)

## 2015-06-06 LAB — HCV RNA,LIPA RFLX NS5A DRUG RESIST

## 2015-06-07 LAB — PRESCRIPTION MONITORING PROFILE (19 PANEL)
AMPHETAMINE/METH: NEGATIVE ng/mL
BARBITURATE SCREEN, URINE: NEGATIVE ng/mL
BENZODIAZEPINE SCREEN, URINE: NEGATIVE ng/mL
CARISOPRODOL, URINE: NEGATIVE ng/mL
COCAINE METABOLITES: NEGATIVE ng/mL
Cannabinoid Scrn, Ur: NEGATIVE ng/mL
Creatinine, Urine: 131.75 mg/dL (ref >20.0)
Fentanyl, Ur: NEGATIVE ng/mL
MDMA URINE: NEGATIVE ng/mL
Meperidine, Ur: NEGATIVE ng/mL
Methadone Screen, Urine: NEGATIVE ng/mL
Methaqualone: NEGATIVE ng/mL
NITRITES URINE, INITIAL: NEGATIVE ug/mL
OXYCODONE SCRN UR: NEGATIVE ng/mL
PROPOXYPHENE: NEGATIVE ng/mL
Phencyclidine, Ur: NEGATIVE ng/mL
TAPENTADOLUR: NEGATIVE ng/mL
TRAMADOL UR: NEGATIVE ng/mL
ZOLPIDEM, URINE: NEGATIVE ng/mL
pH, Initial: 7.8 pH (ref 4.5–8.9)

## 2015-06-09 ENCOUNTER — Ambulatory Visit (INDEPENDENT_AMBULATORY_CARE_PROVIDER_SITE_OTHER): Payer: Medicaid Other | Admitting: Obstetrics and Gynecology

## 2015-06-09 ENCOUNTER — Encounter: Payer: Self-pay | Admitting: Obstetrics and Gynecology

## 2015-06-09 VITALS — BP 107/65 | HR 61 | Temp 98.4°F | Wt 171.0 lb

## 2015-06-09 DIAGNOSIS — F1910 Other psychoactive substance abuse, uncomplicated: Secondary | ICD-10-CM | POA: Diagnosis not present

## 2015-06-09 DIAGNOSIS — O09213 Supervision of pregnancy with history of pre-term labor, third trimester: Secondary | ICD-10-CM | POA: Diagnosis not present

## 2015-06-09 DIAGNOSIS — O99323 Drug use complicating pregnancy, third trimester: Secondary | ICD-10-CM

## 2015-06-09 DIAGNOSIS — O0993 Supervision of high risk pregnancy, unspecified, third trimester: Secondary | ICD-10-CM

## 2015-06-09 DIAGNOSIS — O0933 Supervision of pregnancy with insufficient antenatal care, third trimester: Secondary | ICD-10-CM | POA: Diagnosis not present

## 2015-06-09 DIAGNOSIS — Z23 Encounter for immunization: Secondary | ICD-10-CM | POA: Diagnosis not present

## 2015-06-09 LAB — POCT URINALYSIS DIP (DEVICE)
Glucose, UA: NEGATIVE mg/dL
Hgb urine dipstick: NEGATIVE
Ketones, ur: NEGATIVE mg/dL
NITRITE: NEGATIVE
Protein, ur: 30 mg/dL — AB
Specific Gravity, Urine: 1.02 (ref 1.005–1.030)
Urobilinogen, UA: 0.2 mg/dL (ref 0.0–1.0)
pH: 7.5 (ref 5.0–8.0)

## 2015-06-09 MED ORDER — TETANUS-DIPHTH-ACELL PERTUSSIS 5-2.5-18.5 LF-MCG/0.5 IM SUSP
0.5000 mL | Freq: Once | INTRAMUSCULAR | Status: AC
  Administered 2015-06-09: 0.5 mL via INTRAMUSCULAR

## 2015-06-09 NOTE — Progress Notes (Signed)
Subjective:  [name] [name] is a 35 y.o. G2P0101 at 540w6d being seen today for ongoing prenatal care.  She is currently monitored for the following issues for this high-risk pregnancy and has Hepatitis C antibody test positive; Supervision of high risk pregnancy in third trimester; High risk pregnancy due to history of preterm labor in third trimester; Insufficient prenatal care in third trimester; and Substance abuse affecting pregnancy in third trimester, antepartum on her problem list.  Patient reports no complaints.  Contractions: Not present. Vag. Bleeding: None.  Movement: Present. Denies leaking of fluid.   The following portions of the patient's history were reviewed and updated as appropriate: allergies, current medications, past family history, past medical history, past social history, past surgical history and problem list. Problem list updated.  Objective:   Filed Vitals:   06/09/15 1146  BP: 107/65  Pulse: 61  Temp: 98.4 F (36.9 C)  Weight: 171 lb (77.565 kg)    Fetal Status: Fetal Heart Rate (bpm): 156 Fundal Height: 36 cm Movement: Present     General:  Alert, oriented and cooperative. Patient is in no acute distress.  Skin: Skin is warm and dry. No rash noted.   Cardiovascular: Normal heart rate noted  Respiratory: Normal respiratory effort, no problems with respiration noted  Abdomen: Soft, gravid, appropriate for gestational age. Pain/Pressure: Present     Pelvic: Vag. Bleeding: None     Cervical exam deferred        Extremities: Normal range of motion.  Edema: None  Mental Status: Normal mood and affect. Normal behavior. Normal judgment and thought content.   Urinalysis: Urine Protein: 1+ Urine Glucose: Negative  Assessment and Plan:  Pregnancy: G2P0101 at 3540w6d  1. Supervision of high risk pregnancy in third trimester Discussed the risk of NAS associated with the use of suboxone. Patient reports that she now cutting down to 2 mg daily. She declined NICU tour.   - Tdap (BOOSTRIX) injection 0.5 mL; Inject 0.5 mLs into the muscle once.  Preterm labor symptoms and general obstetric precautions including but not limited to vaginal bleeding, contractions, leaking of fluid and fetal movement were reviewed in detail with the patient. Please refer to After Visit Summary for other counseling recommendations.  Return in about 1 week (around 06/16/2015).   Catalina AntiguaPeggy Treasure Ochs, MD

## 2015-06-11 LAB — HCV RNA NS5A DRUG RESISTANCE

## 2015-06-16 ENCOUNTER — Ambulatory Visit (INDEPENDENT_AMBULATORY_CARE_PROVIDER_SITE_OTHER): Payer: Medicaid Other | Admitting: Obstetrics & Gynecology

## 2015-06-16 ENCOUNTER — Encounter: Payer: Self-pay | Admitting: *Deleted

## 2015-06-16 VITALS — BP 112/66 | HR 70 | Temp 97.9°F | Wt 175.0 lb

## 2015-06-16 DIAGNOSIS — O0993 Supervision of high risk pregnancy, unspecified, third trimester: Secondary | ICD-10-CM

## 2015-06-16 DIAGNOSIS — R768 Other specified abnormal immunological findings in serum: Secondary | ICD-10-CM

## 2015-06-16 DIAGNOSIS — B1920 Unspecified viral hepatitis C without hepatic coma: Secondary | ICD-10-CM | POA: Diagnosis not present

## 2015-06-16 DIAGNOSIS — O09212 Supervision of pregnancy with history of pre-term labor, second trimester: Secondary | ICD-10-CM | POA: Diagnosis not present

## 2015-06-16 DIAGNOSIS — O99323 Drug use complicating pregnancy, third trimester: Secondary | ICD-10-CM | POA: Diagnosis not present

## 2015-06-16 DIAGNOSIS — O0933 Supervision of pregnancy with insufficient antenatal care, third trimester: Secondary | ICD-10-CM

## 2015-06-16 DIAGNOSIS — O98413 Viral hepatitis complicating pregnancy, third trimester: Secondary | ICD-10-CM | POA: Diagnosis not present

## 2015-06-16 DIAGNOSIS — F1190 Opioid use, unspecified, uncomplicated: Secondary | ICD-10-CM | POA: Diagnosis not present

## 2015-06-16 LAB — COMPREHENSIVE METABOLIC PANEL
ALT: 10 U/L (ref 6–29)
AST: 13 U/L (ref 10–30)
Albumin: 3 g/dL — ABNORMAL LOW (ref 3.6–5.1)
Alkaline Phosphatase: 158 U/L — ABNORMAL HIGH (ref 33–115)
BUN: 5 mg/dL — AB (ref 7–25)
CHLORIDE: 107 mmol/L (ref 98–110)
CO2: 21 mmol/L (ref 20–31)
CREATININE: 0.6 mg/dL (ref 0.50–1.10)
Calcium: 9.1 mg/dL (ref 8.6–10.2)
GLUCOSE: 58 mg/dL — AB (ref 65–99)
POTASSIUM: 4.2 mmol/L (ref 3.5–5.3)
SODIUM: 137 mmol/L (ref 135–146)
Total Bilirubin: 0.3 mg/dL (ref 0.2–1.2)
Total Protein: 6 g/dL — ABNORMAL LOW (ref 6.1–8.1)

## 2015-06-16 LAB — POCT URINALYSIS DIP (DEVICE)
Bilirubin Urine: NEGATIVE
Glucose, UA: NEGATIVE mg/dL
HGB URINE DIPSTICK: NEGATIVE
Ketones, ur: NEGATIVE mg/dL
NITRITE: NEGATIVE
PH: 7 (ref 5.0–8.0)
Protein, ur: 30 mg/dL — AB
Specific Gravity, Urine: 1.02 (ref 1.005–1.030)
UROBILINOGEN UA: 1 mg/dL (ref 0.0–1.0)

## 2015-06-16 NOTE — Progress Notes (Signed)
Subjective:  [name] [name] is a 35 y.o. G2P0101 at 22w6dbeing seen today for ongoing prenatal care.  She is currently monitored for the following issues for this high-risk pregnancy and has Hepatitis C antibody test positive; Supervision of high risk pregnancy in third trimester; High risk pregnancy due to history of preterm labor in third trimester; Insufficient prenatal care in third trimester; and Substance abuse affecting pregnancy in third trimester, antepartum on her problem list.  Patient reports no complaints.  Contractions: Not present. Vag. Bleeding: None.  Movement: Present. Denies leaking of fluid.   The following portions of the patient's history were reviewed and updated as appropriate: allergies, current medications, past family history, past medical history, past social history, past surgical history and problem list. Problem list updated.  Objective:   Filed Vitals:   06/16/15 1153  BP: 112/66  Pulse: 70  Temp: 97.9 F (36.6 C)  Weight: 175 lb (79.379 kg)    Fetal Status: Fetal Heart Rate (bpm): 151 Fundal Height: 36 cm Movement: Present     General:  Alert, oriented and cooperative. Patient is in no acute distress.  Skin: Skin is warm and dry. No rash noted.   Cardiovascular: Normal heart rate noted  Respiratory: Normal respiratory effort, no problems with respiration noted  Abdomen: Soft, gravid, appropriate for gestational age. Pain/Pressure: Present     Pelvic: Vag. Bleeding: None Vag D/C Character: White   Cervical exam deferred        Extremities: Normal range of motion.  Edema: None  Mental Status: Normal mood and affect. Normal behavior. Normal judgment and thought content.   Urinalysis: Urine Protein: 1+ Urine Glucose: Negative  Assessment and Plan:  Pregnancy: G2P0101 at 339w6d1. Supervision of high risk pregnancy in third trimester - Smoking cessation encourage - BTL papers  2. Hepatitis C virus infection without hepatic coma, unspecified  chronicity  - Ambulatory referral to Infectious Disease - Comp Met (CMET)    Term labor symptoms and general obstetric precautions including but not limited to vaginal bleeding, contractions, leaking of fluid and fetal movement were reviewed in detail with the patient. Please refer to After Visit Summary for other counseling recommendations.  Return in about 1 week (around 06/23/2015).   KeGuss BundeMD

## 2015-06-23 ENCOUNTER — Ambulatory Visit (INDEPENDENT_AMBULATORY_CARE_PROVIDER_SITE_OTHER): Payer: Medicaid Other | Admitting: Obstetrics and Gynecology

## 2015-06-23 ENCOUNTER — Encounter: Payer: Self-pay | Admitting: Obstetrics and Gynecology

## 2015-06-23 ENCOUNTER — Other Ambulatory Visit (HOSPITAL_COMMUNITY)
Admission: RE | Admit: 2015-06-23 | Discharge: 2015-06-23 | Disposition: A | Discharge status: Home / Self Care | Payer: Medicaid Other | Source: Ambulatory Visit | Attending: Obstetrics and Gynecology | Admitting: Obstetrics and Gynecology

## 2015-06-23 VITALS — BP 116/66 | HR 81 | Wt 182.1 lb

## 2015-06-23 DIAGNOSIS — O09213 Supervision of pregnancy with history of pre-term labor, third trimester: Secondary | ICD-10-CM

## 2015-06-23 DIAGNOSIS — F1910 Other psychoactive substance abuse, uncomplicated: Secondary | ICD-10-CM | POA: Diagnosis not present

## 2015-06-23 DIAGNOSIS — O0993 Supervision of high risk pregnancy, unspecified, third trimester: Secondary | ICD-10-CM

## 2015-06-23 DIAGNOSIS — O99323 Drug use complicating pregnancy, third trimester: Secondary | ICD-10-CM

## 2015-06-23 DIAGNOSIS — Z113 Encounter for screening for infections with a predominantly sexual mode of transmission: Secondary | ICD-10-CM | POA: Diagnosis present

## 2015-06-23 DIAGNOSIS — O0933 Supervision of pregnancy with insufficient antenatal care, third trimester: Secondary | ICD-10-CM | POA: Diagnosis present

## 2015-06-23 LAB — POCT URINALYSIS DIP (DEVICE)
Bilirubin Urine: NEGATIVE
GLUCOSE, UA: NEGATIVE mg/dL
HGB URINE DIPSTICK: NEGATIVE
Ketones, ur: NEGATIVE mg/dL
NITRITE: NEGATIVE
PROTEIN: NEGATIVE mg/dL
Specific Gravity, Urine: 1.02 (ref 1.005–1.030)
UROBILINOGEN UA: 0.2 mg/dL (ref 0.0–1.0)
pH: 7 (ref 5.0–8.0)

## 2015-06-23 LAB — OB RESULTS CONSOLE GBS: STREP GROUP B AG: NEGATIVE

## 2015-06-23 NOTE — Progress Notes (Signed)
Subjective:  [name] [name] is a 35 y.o. G2P0101 at 5378w6d being seen today for ongoing prenatal care.  She is currently monitored for the following issues for this high-risk pregnancy and has Hepatitis C antibody test positive; Supervision of high risk pregnancy in third trimester; High risk pregnancy due to history of preterm labor in third trimester; Insufficient prenatal care in third trimester; and Substance abuse affecting pregnancy in third trimester, antepartum on her problem list.  Patient reports no complaints.  Contractions: Not present. Vag. Bleeding: None.  Movement: Present. Denies leaking of fluid.   The following portions of the patient's history were reviewed and updated as appropriate: allergies, current medications, past family history, past medical history, past social history, past surgical history and problem list. Problem list updated.  Objective:   Filed Vitals:   06/23/15 0928  BP: 116/66  Pulse: 81  Weight: 182 lb 1.6 oz (82.6 kg)    Fetal Status: Fetal Heart Rate (bpm): 150 Fundal Height: 37 cm Movement: Present  Presentation: Vertex  General:  Alert, oriented and cooperative. Patient is in no acute distress.  Skin: Skin is warm and dry. No rash noted.   Cardiovascular: Normal heart rate noted  Respiratory: Normal respiratory effort, no problems with respiration noted  Abdomen: Soft, gravid, appropriate for gestational age. Pain/Pressure: Absent     Pelvic: Vag. Bleeding: None Vag D/C Character: White   Cervical exam performed Dilation: 1 Effacement (%): 0 Station: -3  Extremities: Normal range of motion.  Edema: None  Mental Status: Normal mood and affect. Normal behavior. Normal judgment and thought content.   Urinalysis:      Assessment and Plan:  Pregnancy: G2P0101 at 4978w6d  1. Supervision of high risk pregnancy in third trimester   2. Insufficient prenatal care in third trimester   3. Substance abuse affecting pregnancy in third trimester,  antepartum Continue buprenorphine  4. High risk pregnancy due to history of preterm labor in third trimester   Term labor symptoms and general obstetric precautions including but not limited to vaginal bleeding, contractions, leaking of fluid and fetal movement were reviewed in detail with the patient. Please refer to After Visit Summary for other counseling recommendations.  Return in about 1 day (around 06/24/2015).   Catalina AntiguaPeggy Dannika Hilgeman, MD

## 2015-06-24 LAB — GC/CHLAMYDIA PROBE AMP (~~LOC~~) NOT AT ARMC
CHLAMYDIA, DNA PROBE: NEGATIVE
NEISSERIA GONORRHEA: NEGATIVE

## 2015-06-25 LAB — CULTURE, BETA STREP (GROUP B ONLY)

## 2015-07-03 ENCOUNTER — Ambulatory Visit (INDEPENDENT_AMBULATORY_CARE_PROVIDER_SITE_OTHER): Payer: Medicaid Other | Admitting: Obstetrics & Gynecology

## 2015-07-03 VITALS — BP 115/70 | HR 77 | Temp 98.1°F | Wt 185.0 lb

## 2015-07-03 DIAGNOSIS — O99323 Drug use complicating pregnancy, third trimester: Secondary | ICD-10-CM

## 2015-07-03 DIAGNOSIS — O0993 Supervision of high risk pregnancy, unspecified, third trimester: Secondary | ICD-10-CM

## 2015-07-03 DIAGNOSIS — O480 Post-term pregnancy: Secondary | ICD-10-CM

## 2015-07-03 DIAGNOSIS — F1910 Other psychoactive substance abuse, uncomplicated: Secondary | ICD-10-CM

## 2015-07-03 LAB — POCT URINALYSIS DIP (DEVICE)
GLUCOSE, UA: NEGATIVE mg/dL
Hgb urine dipstick: NEGATIVE
NITRITE: NEGATIVE
PH: 6 (ref 5.0–8.0)
PROTEIN: NEGATIVE mg/dL
Specific Gravity, Urine: 1.025 (ref 1.005–1.030)
UROBILINOGEN UA: 0.2 mg/dL (ref 0.0–1.0)

## 2015-07-03 NOTE — Progress Notes (Signed)
Subjective:  [name] [name] is a 35 y.o. G2P0101 at 6041w2d being seen today for ongoing prenatal care.  She is currently monitored for the following issues for this high-risk pregnancy and has Hepatitis C antibody test positive; Supervision of high risk pregnancy in third trimester; High risk pregnancy due to history of preterm labor in third trimester; Insufficient prenatal care in third trimester; and Substance abuse affecting pregnancy in third trimester, antepartum on her problem list.  Patient reports no complaints.  Contractions: Not present. Vag. Bleeding: None.  Movement: Present. Denies leaking of fluid.   The following portions of the patient's history were reviewed and updated as appropriate: allergies, current medications, past family history, past medical history, past social history, past surgical history and problem list. Problem list updated.  Objective:   Filed Vitals:   07/03/15 1140  BP: 115/70  Pulse: 77  Temp: 98.1 F (36.7 C)  Weight: 185 lb (83.915 kg)    Fetal Status: Fetal Heart Rate (bpm): 145 Fundal Height: 38 cm Movement: Present  Presentation: Vertex  General:  Alert, oriented and cooperative. Patient is in no acute distress.  Skin: Skin is warm and dry. No rash noted.   Cardiovascular: Normal heart rate noted  Respiratory: Normal respiratory effort, no problems with respiration noted  Abdomen: Soft, gravid, appropriate for gestational age. Pain/Pressure: Present     Pelvic: Vag. Bleeding: None Vag D/C Character: White   Cervical exam performed Dilation: 1 Effacement (%): 70 Station: -3  Extremities: Normal range of motion.  Edema: Trace  Mental Status: Normal mood and affect. Normal behavior. Normal judgment and thought content.   Urinalysis: Urine Protein: Negative Urine Glucose: Negative  Assessment and Plan:  Pregnancy: G2P0101 at 7141w2d  1. Post term pregnancy over 40 weeks BPP and NST ordered 07/07/15. IOL to be scheduled at 41 weeks, order form  faxed to L&D.   2. Substance abuse affecting pregnancy in third trimester, antepartum Continue Suboxone therapy.   3. Supervision of high risk pregnancy in third trimester Term labor symptoms and general obstetric precautions including but not limited to vaginal bleeding, contractions, leaking of fluid and fetal movement were reviewed in detail with the patient. Please refer to After Visit Summary for other counseling recommendations.  Return in about 7 weeks (around 08/21/2015) for Postpartum check.   Tereso NewcomerUgonna A Kiwan Gadsden, MD

## 2015-07-04 ENCOUNTER — Telehealth (HOSPITAL_COMMUNITY): Payer: Self-pay | Admitting: *Deleted

## 2015-07-04 ENCOUNTER — Encounter (HOSPITAL_COMMUNITY): Payer: Self-pay | Admitting: *Deleted

## 2015-07-04 NOTE — Telephone Encounter (Signed)
Preadmission screen  

## 2015-07-07 ENCOUNTER — Other Ambulatory Visit: Payer: Self-pay | Admitting: Obstetrics & Gynecology

## 2015-07-07 ENCOUNTER — Ambulatory Visit (HOSPITAL_COMMUNITY)
Admission: RE | Admit: 2015-07-07 | Discharge: 2015-07-07 | Disposition: A | Discharge status: Home / Self Care | Payer: Medicaid Other | Source: Ambulatory Visit | Attending: Obstetrics & Gynecology | Admitting: Obstetrics & Gynecology

## 2015-07-07 DIAGNOSIS — Z3A18 18 weeks gestation of pregnancy: Secondary | ICD-10-CM | POA: Diagnosis not present

## 2015-07-07 DIAGNOSIS — O99323 Drug use complicating pregnancy, third trimester: Secondary | ICD-10-CM | POA: Diagnosis not present

## 2015-07-07 DIAGNOSIS — O09213 Supervision of pregnancy with history of pre-term labor, third trimester: Secondary | ICD-10-CM | POA: Diagnosis not present

## 2015-07-07 DIAGNOSIS — O98413 Viral hepatitis complicating pregnancy, third trimester: Secondary | ICD-10-CM | POA: Diagnosis not present

## 2015-07-07 DIAGNOSIS — B182 Chronic viral hepatitis C: Secondary | ICD-10-CM | POA: Diagnosis not present

## 2015-07-07 DIAGNOSIS — O480 Post-term pregnancy: Secondary | ICD-10-CM

## 2015-07-07 DIAGNOSIS — Z3A40 40 weeks gestation of pregnancy: Secondary | ICD-10-CM | POA: Diagnosis not present

## 2015-07-07 DIAGNOSIS — O0933 Supervision of pregnancy with insufficient antenatal care, third trimester: Secondary | ICD-10-CM | POA: Diagnosis not present

## 2015-07-07 DIAGNOSIS — O0993 Supervision of high risk pregnancy, unspecified, third trimester: Secondary | ICD-10-CM

## 2015-07-08 ENCOUNTER — Encounter: Payer: Self-pay | Admitting: *Deleted

## 2015-07-10 ENCOUNTER — Encounter (HOSPITAL_COMMUNITY): Payer: Self-pay

## 2015-07-10 ENCOUNTER — Inpatient Hospital Stay (HOSPITAL_COMMUNITY)
Admission: RE | Admit: 2015-07-10 | Discharge: 2015-07-13 | DRG: 775 | Disposition: A | Discharge status: Home / Self Care | Payer: Medicaid Other | Source: Ambulatory Visit | Attending: Obstetrics & Gynecology | Admitting: Obstetrics & Gynecology

## 2015-07-10 VITALS — BP 109/65 | HR 61 | Temp 97.4°F | Resp 18 | Ht 66.0 in | Wt 185.0 lb

## 2015-07-10 DIAGNOSIS — Z8249 Family history of ischemic heart disease and other diseases of the circulatory system: Secondary | ICD-10-CM

## 2015-07-10 DIAGNOSIS — Z833 Family history of diabetes mellitus: Secondary | ICD-10-CM | POA: Diagnosis not present

## 2015-07-10 DIAGNOSIS — O99323 Drug use complicating pregnancy, third trimester: Secondary | ICD-10-CM

## 2015-07-10 DIAGNOSIS — K219 Gastro-esophageal reflux disease without esophagitis: Secondary | ICD-10-CM | POA: Diagnosis present

## 2015-07-10 DIAGNOSIS — O99324 Drug use complicating childbirth: Secondary | ICD-10-CM | POA: Diagnosis present

## 2015-07-10 DIAGNOSIS — F1110 Opioid abuse, uncomplicated: Secondary | ICD-10-CM | POA: Diagnosis present

## 2015-07-10 DIAGNOSIS — O99334 Smoking (tobacco) complicating childbirth: Secondary | ICD-10-CM | POA: Diagnosis present

## 2015-07-10 DIAGNOSIS — O09213 Supervision of pregnancy with history of pre-term labor, third trimester: Secondary | ICD-10-CM

## 2015-07-10 DIAGNOSIS — O0993 Supervision of high risk pregnancy, unspecified, third trimester: Secondary | ICD-10-CM

## 2015-07-10 DIAGNOSIS — Z3A41 41 weeks gestation of pregnancy: Secondary | ICD-10-CM | POA: Diagnosis not present

## 2015-07-10 DIAGNOSIS — F1190 Opioid use, unspecified, uncomplicated: Secondary | ICD-10-CM | POA: Diagnosis not present

## 2015-07-10 DIAGNOSIS — O480 Post-term pregnancy: Secondary | ICD-10-CM | POA: Diagnosis present

## 2015-07-10 DIAGNOSIS — F17210 Nicotine dependence, cigarettes, uncomplicated: Secondary | ICD-10-CM | POA: Diagnosis present

## 2015-07-10 DIAGNOSIS — O9962 Diseases of the digestive system complicating childbirth: Secondary | ICD-10-CM | POA: Diagnosis present

## 2015-07-10 DIAGNOSIS — O0933 Supervision of pregnancy with insufficient antenatal care, third trimester: Secondary | ICD-10-CM

## 2015-07-10 LAB — TYPE AND SCREEN
ABO/RH(D): O POS
ANTIBODY SCREEN: NEGATIVE

## 2015-07-10 LAB — CBC
HEMATOCRIT: 36.7 % (ref 36.0–46.0)
HEMOGLOBIN: 12.3 g/dL (ref 12.0–15.0)
MCH: 29.5 pg (ref 26.0–34.0)
MCHC: 33.5 g/dL (ref 30.0–36.0)
MCV: 88 fL (ref 78.0–100.0)
Platelets: 291 10*3/uL (ref 150–400)
RBC: 4.17 MIL/uL (ref 3.87–5.11)
RDW: 16.5 % — AB (ref 11.5–15.5)
WBC: 12.8 10*3/uL — AB (ref 4.0–10.5)

## 2015-07-10 MED ORDER — ACETAMINOPHEN 325 MG PO TABS: 650.0000 mg | ORAL_TABLET | ORAL | Status: DC | PRN

## 2015-07-10 MED ORDER — CITRIC ACID-SODIUM CITRATE 334-500 MG/5ML PO SOLN: 30.0000 mL | ORAL | Status: DC | PRN

## 2015-07-10 MED ORDER — OXYTOCIN 10 UNIT/ML IJ SOLN: 2.5000 [IU]/h | INTRAVENOUS | Status: DC

## 2015-07-10 MED ORDER — MISOPROSTOL 50MCG HALF TABLET: 50.0000 ug | ORAL_TABLET | ORAL | Status: DC

## 2015-07-10 MED ORDER — NICOTINE 14 MG/24HR TD PT24
14.0000 mg | MEDICATED_PATCH | Freq: Every day | TRANSDERMAL | Status: DC
  Administered 2015-07-10 – 2015-07-11 (×2): 14 mg via TRANSDERMAL
  Filled 2015-07-10 (×5): qty 1

## 2015-07-10 MED ORDER — LACTATED RINGERS IV SOLN: 500.0000 mL | INTRAVENOUS | Status: DC | PRN

## 2015-07-10 MED ORDER — MISOPROSTOL 25 MCG QUARTER TABLET
25.0000 ug | ORAL_TABLET | ORAL | Status: DC
  Administered 2015-07-10: 25 ug via VAGINAL
  Filled 2015-07-10 (×2): qty 0.25

## 2015-07-10 MED ORDER — MISOPROSTOL 200 MCG PO TABS
50.0000 ug | ORAL_TABLET | ORAL | Status: DC
  Administered 2015-07-10: 50 ug via ORAL
  Filled 2015-07-10: qty 0.5

## 2015-07-10 MED ORDER — OXYTOCIN 10 UNIT/ML IJ SOLN
1.0000 m[IU]/min | INTRAMUSCULAR | Status: DC
  Administered 2015-07-11: 2 m[IU]/min via INTRAVENOUS
  Filled 2015-07-10: qty 4

## 2015-07-10 MED ORDER — LACTATED RINGERS IV SOLN
INTRAVENOUS | Status: DC
  Administered 2015-07-10 – 2015-07-11 (×2): via INTRAVENOUS

## 2015-07-10 MED ORDER — LIDOCAINE HCL (PF) 1 % IJ SOLN
30.0000 mL | INTRAMUSCULAR | Status: AC | PRN
  Administered 2015-07-11: 30 mL via SUBCUTANEOUS
  Filled 2015-07-10: qty 30

## 2015-07-10 MED ORDER — ONDANSETRON HCL 4 MG/2ML IJ SOLN: 4.0000 mg | Freq: Four times a day (QID) | INTRAMUSCULAR | Status: DC | PRN

## 2015-07-10 MED ORDER — BUPRENORPHINE HCL 8 MG SL SUBL
4.0000 mg | SUBLINGUAL_TABLET | Freq: Two times a day (BID) | SUBLINGUAL | Status: DC
  Administered 2015-07-10 – 2015-07-13 (×7): 4 mg via SUBLINGUAL
  Filled 2015-07-10 (×7): qty 1

## 2015-07-10 MED ORDER — OXYTOCIN BOLUS FROM INFUSION: 500.0000 mL | INTRAVENOUS | Status: DC

## 2015-07-10 NOTE — H&P (Signed)
LABOR AND DELIVERY ADMISSION HISTORY AND PHYSICAL NOTE  [name] [name] is a 35 y.o. female G42P0101 with IUP at 50w2dby LMP presenting for IOL for postdates. She has a history of preterm labor at 29 wks with her first pregnancy.  She reports positive fetal movement. She denies leakage of fluid or vaginal bleeding. She has not felt any contractions. She denies headache, vision change, abdominal pain, fever, nausea/vomiting.  Prenatal History/Complications: late to prenatal care at 319w6dHep C positive, 1 pack/day smoker, heroin use up until ~3 months ago (now on Subutex), pt was homeless until recently.  Past Medical History: Past Medical History  Diagnosis Date  . GERD (gastroesophageal reflux disease)   . Substance abuse     subutex    Past Surgical History: Past Surgical History  Procedure Laterality Date  . I&d extremity  08/20/2011    Procedure: IRRIGATION AND DEBRIDEMENT EXTREMITY;  Surgeon: KeTennis MustMD;  [facility];  Laterality: Right;  . Hand surgery      hand infection  . I&d extremity  11/04/2011    Procedure: IRRIGATION AND DEBRIDEMENT EXTREMITY;  Surgeon: FrLinna HoffMD;  [facility];  Laterality: Left;  I & D ledft hand    Obstetrical History: OB History    Gravida Para Term Preterm AB TAB SAB Ectopic Multiple Living   2 1  1      1       Social History: Social History   Social History  . Marital Status: Married    Spouse Name: N/A  . Number of Children: N/A  . Years of Education: N/A   Social History Main Topics  . Smoking status: Current Every Day Smoker -- 1.00 packs/day for 13 years    Types: Cigarettes  . Smokeless tobacco: Never Used  . Alcohol Use: No  . Drug Use: Yes    Special: Heroin     Comment: heroin  . Sexual Activity: Yes   Other Topics Concern  . None   Social History Narrative  At her 06/02/15 visit, pt reported being homeless up until recently. She is now living with  someone who is supportive of her and willing to help care for the infant.  Family History: Family History  Problem Relation Age of Onset  . Cancer Mother   . Diabetes Mother   . Hyperlipidemia Mother   . Hypertension Father   . Hyperlipidemia Father     Allergies: No Known Allergies  Prescriptions prior to admission  Medication Sig Dispense Refill Last Dose  . Prenatal Vit-Fe Fumarate-FA (MULTIVITAMIN-PRENATAL) 27-0.8 MG TABS tablet Take 1 tablet by mouth daily at 12 noon.    Taking  Subutex 4 mg bid   Review of Systems   All systems reviewed and negative except as stated in HPI  Blood pressure 131/83, pulse 111, temperature 98.1 F (36.7 C), temperature source Oral, resp. rate 17, last menstrual period 09/24/2014. General appearance: alert, cooperative and no distress Lungs: clear to auscultation bilaterally Heart: regular rate and rhythm Abdomen: soft, non-tender, gravid; bowel sounds normal Extremities: No calf swelling or tenderness Presentation: cephalic Fetal monitoring: 140 bpm, moderate var, +accels, no decels Uterine activity: infrequent Dilation: 1 Effacement (%): Thick Exam by:: mayo md  [place] confirmed cephalic presentation.  Prenatal labs: ABO, Rh: O/POS/-- (02/27 1017) Antibody: NEG (02/27 1017) Rubella: 1.01 on 05/24/15 (immune), 0.88 on 06/02/15 (not immune) RPR: NON REAC (02/27 1017)  HBsAg: NEGATIVE (02/27 1017)  HIV: NONREACTIVE (02/27 1017)  GBS: Negative (03/20 0000)  HCV-RNA quant RT-PCR: 409 IU/mL (ref <15), 2.61 logIU/mL (ref <1.18) (06/02/15) 1 hr Glucola: 126 (3rd tri) Genetic screening: too late Anatomy US: normal but limited due to size  Prenatal Transfer Tool  Maternal Diabetes: No Genetic Screening: Declined Maternal Ultrasounds/Referrals: Normal Fetal Ultrasounds or other Referrals:  None Maternal Substance Abuse:  Yes:  Type: Smoker, Other: heroin Significant Maternal Medications:  Meds include: Other: Subutex 4 mg  bid Significant Maternal Lab Results: Lab values include: Group B Strep negative, Other: Hep C positive  No results found for this or any previous visit (from the past 24 hour(s)).  Patient Active Problem List   Diagnosis Date Noted  . Substance abuse affecting pregnancy in third trimester, antepartum 06/09/2015  . Supervision of high risk pregnancy in third trimester 06/02/2015  . High risk pregnancy due to history of preterm labor in third trimester 06/02/2015  . Insufficient prenatal care in third trimester 06/02/2015  . Hepatitis C antibody test positive 05/26/2015    Assessment: [name] [name] is a 35 y.o. G2P0101 at 51w2dhere for IOL for postdates, with pregnancy complicated by late prenatal care, heroin use, 1 pack/day smoking, and Hep C.  #Labor: IOL with Cytotec. Will likely start Pitocin. #Pain: planning on epidural #FWB: Category I #ID: GBS negative. Hep C positive. Will need referral to ID. #Rubella non-immune: Plan for MMR after delivery #Heroin use: continue Subutex 4 mg bid #Smoking: nicotine patch ordered #Social situation: SW consult ordered #MOF: formula #MOC: BTL (consent signed 06/16/15) #Circ: outpt   Pt was seen by KUrbano Heir(medical student) and myself.  KBerna SpareMayo 07/10/2015, 4:08 PM  OB fellow attestation: I have seen and examined this patient; I agree with above documentation in the resident's note.   [name] ZULLOis a 35y.o. [id number] for IOL for postdates  PE: BP 113/72 mmHg  Pulse 67  Temp(Src) 98 F (36.7 C) (Oral)  Resp 16  Ht 5' 6"  (1.676 m)  Wt 185 lb (83.915 kg)  BMI 29.87 kg/m2  LMP 09/24/2014 Gen: calm comfortable, NAD Resp: normal effort, no distress Abd: gravid  ROS, labs, PMH reviewed  Plan: Admit to LD IOL, plan for cytotec FWB Cat I GBS Neg Chronic medical conditions-- plan for management postpartum.  Suboxone- continue pp, monitor infant for withdrawl   KCaren Macadam MD , MPH, AClarion  OB Fellow [facility]

## 2015-07-10 NOTE — Progress Notes (Signed)
   [name] [name] is a 35 y.o. G2P0101 at 4326w2d  admitted for induction of labor due to Post dates.   Subjective: No complaints, ctx are mild  Objective: Filed Vitals:   07/10/15 2004 07/10/15 2115 07/10/15 2326 07/10/15 2327  BP: 113/72 116/65  114/74  Pulse: 67 72  78  Temp: 98 F (36.7 C)  98.4 F (36.9 C)   TempSrc: Oral  Oral   Resp: 16     Height:      Weight:          FHT:  FHR: 130 bpm, variability: moderate,  accelerations:  Present,  decelerations:  Absent UC:   irregular, every 1-4 minutes SVE:   Dilation: 1 Effacement (%): 50 Station: -2 Exam by:: F Cresenzo CNM Foley placed in cx and inflated with 60cc H20  Labs: Lab Results  Component Value Date   WBC 12.8* 07/10/2015   HGB 12.3 07/10/2015   HCT 36.7 07/10/2015   MCV 88.0 07/10/2015   PLT 291 07/10/2015    Assessment / Plan: IOL for postdates, ripeing phase. Opiate addicition, on replacement therapy  Labor: ripening phase Fetal Wellbeing:  Category I Pain Control:  Labor support without medications Anticipated MOD:  NSVD  CRESENZO-DISHMAN,Stalin Gruenberg 07/10/2015, 11:58 PM

## 2015-07-10 NOTE — Progress Notes (Signed)
Patient ID: [name] [name], female   DOB: Feb 03, 1981, 35 y.o.   MRN: [id number]   Labor Progress Note [name] [name] is a 35 y.o. G2P0101 at 8233w2d presented for IOL for postdates.  S:  Pt is sitting up comfortably and eating. She received Cytotec ~6:30pm and feels slightly increased frequency of contractions now.  O:  BP 109/67 mmHg  Pulse 59  Temp(Src) 98.2 F (36.8 C) (Oral)  Resp 16  Ht 5\' 6"  (1.676 m)  Wt 83.915 kg (185 lb)  BMI 29.87 kg/m2  LMP 09/24/2014 EFM: 135 bpm/moderate var/+accels, no decels  CVE: Dilation: Fingertip Effacement (%):  (25) Cervical Position: Posterior Presentation:  (vertex per bedside ultrasound , dr. Nancy Marusmayo) Exam by:: m garrison rn    A&P: 35 y.o. [id number] 3133w2d presented for IOL for postdates. #Labor: continue Cytotec q4hrs, with Pitocin as needed #Pain: mild, planning on epidural later on #FWB: Category I #GBS: negative #Smoking: nicotine patch was applied ~18:30  Felton ClintonKali Xu, Med Student 7:44 PM  I evaluated the patient and agree with the medical student's note.  Willadean CarolKaty Quinesha Selinger, MD PGY-1

## 2015-07-11 ENCOUNTER — Encounter (HOSPITAL_COMMUNITY): Payer: Self-pay

## 2015-07-11 ENCOUNTER — Inpatient Hospital Stay (HOSPITAL_COMMUNITY): Payer: Medicaid Other | Admitting: Anesthesiology

## 2015-07-11 DIAGNOSIS — Z3A41 41 weeks gestation of pregnancy: Secondary | ICD-10-CM

## 2015-07-11 DIAGNOSIS — O99324 Drug use complicating childbirth: Secondary | ICD-10-CM

## 2015-07-11 DIAGNOSIS — O480 Post-term pregnancy: Secondary | ICD-10-CM

## 2015-07-11 DIAGNOSIS — F1190 Opioid use, unspecified, uncomplicated: Secondary | ICD-10-CM

## 2015-07-11 LAB — RPR: RPR Ser Ql: NONREACTIVE

## 2015-07-11 MED ORDER — DIPHENHYDRAMINE HCL 25 MG PO CAPS: 25.0000 mg | ORAL_CAPSULE | Freq: Four times a day (QID) | ORAL | Status: DC | PRN

## 2015-07-11 MED ORDER — LIDOCAINE HCL (PF) 1 % IJ SOLN
INTRAMUSCULAR | Status: DC | PRN
  Administered 2015-07-11: 2 mL via EPIDURAL
  Administered 2015-07-11: 3 mL via EPIDURAL
  Administered 2015-07-11: 5 mL via EPIDURAL

## 2015-07-11 MED ORDER — PRENATAL MULTIVITAMIN CH
1.0000 | ORAL_TABLET | Freq: Every day | ORAL | Status: DC
  Administered 2015-07-12 – 2015-07-13 (×2): 1 via ORAL
  Filled 2015-07-11 (×2): qty 1

## 2015-07-11 MED ORDER — ACETAMINOPHEN 325 MG PO TABS: 650.0000 mg | ORAL_TABLET | ORAL | Status: DC | PRN

## 2015-07-11 MED ORDER — WITCH HAZEL-GLYCERIN EX PADS: 1.0000 "application " | MEDICATED_PAD | CUTANEOUS | Status: DC | PRN

## 2015-07-11 MED ORDER — EPHEDRINE 5 MG/ML INJ
10.0000 mg | INTRAVENOUS | Status: DC | PRN
  Filled 2015-07-11: qty 2

## 2015-07-11 MED ORDER — FENTANYL 2.5 MCG/ML BUPIVACAINE 1/10 % EPIDURAL INFUSION (WH - ANES)
14.0000 mL/h | INTRAMUSCULAR | Status: DC | PRN
  Administered 2015-07-11: 14 mL/h via EPIDURAL

## 2015-07-11 MED ORDER — PHENYLEPHRINE 40 MCG/ML (10ML) SYRINGE FOR IV PUSH (FOR BLOOD PRESSURE SUPPORT)
PREFILLED_SYRINGE | INTRAVENOUS | Status: AC
  Filled 2015-07-11: qty 20

## 2015-07-11 MED ORDER — TETANUS-DIPHTH-ACELL PERTUSSIS 5-2.5-18.5 LF-MCG/0.5 IM SUSP: 0.5000 mL | Freq: Once | INTRAMUSCULAR | Status: DC

## 2015-07-11 MED ORDER — LANOLIN HYDROUS EX OINT: TOPICAL_OINTMENT | CUTANEOUS | Status: DC | PRN

## 2015-07-11 MED ORDER — FENTANYL 2.5 MCG/ML BUPIVACAINE 1/10 % EPIDURAL INFUSION (WH - ANES)
INTRAMUSCULAR | Status: AC
  Filled 2015-07-11: qty 125

## 2015-07-11 MED ORDER — ZOLPIDEM TARTRATE 5 MG PO TABS: 5.0000 mg | ORAL_TABLET | Freq: Every evening | ORAL | Status: DC | PRN

## 2015-07-11 MED ORDER — PHENYLEPHRINE 40 MCG/ML (10ML) SYRINGE FOR IV PUSH (FOR BLOOD PRESSURE SUPPORT)
80.0000 ug | PREFILLED_SYRINGE | INTRAVENOUS | Status: DC | PRN
  Filled 2015-07-11: qty 2

## 2015-07-11 MED ORDER — LACTATED RINGERS IV SOLN: 500.0000 mL | Freq: Once | INTRAVENOUS | Status: DC

## 2015-07-11 MED ORDER — SENNOSIDES-DOCUSATE SODIUM 8.6-50 MG PO TABS
2.0000 | ORAL_TABLET | ORAL | Status: DC
  Administered 2015-07-11 – 2015-07-12 (×2): 2 via ORAL
  Filled 2015-07-11 (×2): qty 2

## 2015-07-11 MED ORDER — ONDANSETRON HCL 4 MG/2ML IJ SOLN: 4.0000 mg | INTRAMUSCULAR | Status: DC | PRN

## 2015-07-11 MED ORDER — BENZOCAINE-MENTHOL 20-0.5 % EX AERO: 1.0000 "application " | INHALATION_SPRAY | CUTANEOUS | Status: DC | PRN

## 2015-07-11 MED ORDER — DIBUCAINE 1 % RE OINT: 1.0000 "application " | TOPICAL_OINTMENT | RECTAL | Status: DC | PRN

## 2015-07-11 MED ORDER — IBUPROFEN 600 MG PO TABS
600.0000 mg | ORAL_TABLET | Freq: Four times a day (QID) | ORAL | Status: DC
  Administered 2015-07-11 – 2015-07-13 (×9): 600 mg via ORAL
  Filled 2015-07-11 (×9): qty 1

## 2015-07-11 MED ORDER — DIPHENHYDRAMINE HCL 50 MG/ML IJ SOLN: 12.5000 mg | INTRAMUSCULAR | Status: DC | PRN

## 2015-07-11 MED ORDER — PNEUMOCOCCAL VAC POLYVALENT 25 MCG/0.5ML IJ INJ
0.5000 mL | INJECTION | INTRAMUSCULAR | Status: AC
  Administered 2015-07-13: 0.5 mL via INTRAMUSCULAR
  Filled 2015-07-11 (×2): qty 0.5

## 2015-07-11 MED ORDER — SIMETHICONE 80 MG PO CHEW: 80.0000 mg | CHEWABLE_TABLET | ORAL | Status: DC | PRN

## 2015-07-11 MED ORDER — ONDANSETRON HCL 4 MG PO TABS: 4.0000 mg | ORAL_TABLET | ORAL | Status: DC | PRN

## 2015-07-11 NOTE — Discharge Instructions (Signed)
You will need to schedule a follow up with OB post partum visit to be seen in 1-2 weeks.  Nothing per vagina for the next 6 weeks.  No intercourse, no tampons, no douching.  Postpartum Care After Vaginal Delivery After you deliver your newborn (postpartum period), the usual stay in the hospital is 24-72 hours. If there were problems with your labor or delivery, or if you have other medical problems, you might be in the hospital longer.  While you are in the hospital, you will receive help and instructions on how to care for yourself and your newborn during the postpartum period.  While you are in the hospital:  Be sure to tell your nurses if you have pain or discomfort, as well as where you feel the pain and what makes the pain worse.  If you had an incision made near your vagina (episiotomy) or if you had some tearing during delivery, the nurses may put ice packs on your episiotomy or tear. The ice packs may help to reduce the pain and swelling.  If you are breastfeeding, you may feel uncomfortable contractions of your uterus for a couple of weeks. This is normal. The contractions help your uterus get back to normal size.  It is normal to have some bleeding after delivery.  For the first 1-3 days after delivery, the flow is red and the amount may be similar to a period.  It is common for the flow to start and stop.  In the first few days, you may pass some small clots. Let your nurses know if you begin to pass large clots or your flow increases.  Do not  flush blood clots down the toilet before having the nurse look at them.  During the next 3-10 days after delivery, your flow should become more watery and pink or brown-tinged in color.  Ten to fourteen days after delivery, your flow should be a small amount of yellowish-white discharge.  The amount of your flow will decrease over the first few weeks after delivery. Your flow may stop in 6-8 weeks. Most women have had their flow stop by  12 weeks after delivery.  You should change your sanitary pads frequently.  Wash your hands thoroughly with soap and water for at least 20 seconds after changing pads, using the toilet, or before holding or feeding your newborn.  You should feel like you need to empty your bladder within the first 6-8 hours after delivery.  In case you become weak, lightheaded, or faint, call your nurse before you get out of bed for the first time and before you take a shower for the first time.  Within the first few days after delivery, your breasts may begin to feel tender and full. This is called engorgement. Breast tenderness usually goes away within 48-72 hours after engorgement occurs. You may also notice milk leaking from your breasts. If you are not breastfeeding, do not stimulate your breasts. Breast stimulation can make your breasts produce more milk.  Spending as much time as possible with your newborn is very important. During this time, you and your newborn can feel close and get to know each other. Having your newborn stay in your room (rooming in) will help to strengthen the bond with your newborn. It will give you time to get to know your newborn and become comfortable caring for your newborn.  Your hormones change after delivery. Sometimes the hormone changes can temporarily cause you to feel sad or tearful. These  feelings should not last more than a few days. If these feelings last longer than that, you should talk to your caregiver.  If desired, talk to your caregiver about methods of family planning or contraception.  Talk to your caregiver about immunizations. Your caregiver may want you to have the following immunizations before leaving the hospital:  Tetanus, diphtheria, and pertussis (Tdap) or tetanus and diphtheria (Td) immunization. It is very important that you and your family (including grandparents) or others caring for your newborn are up-to-date with the Tdap or Td immunizations.  The Tdap or Td immunization can help protect your newborn from getting ill.  Rubella immunization.  Varicella (chickenpox) immunization.  Influenza immunization. You should receive this annual immunization if you did not receive the immunization during your pregnancy.   This information is not intended to replace advice given to you by your health care provider. Make sure you discuss any questions you have with your health care provider.   Document Released: 01/17/2007 Document Revised: 12/15/2011 Document Reviewed: 11/17/2011 Elsevier Interactive Patient Education [employer]

## 2015-07-11 NOTE — Progress Notes (Signed)
   [name] [name] is a 35 y.o. G2P0101 at 7286w3d  admitted for induction of labor due to Post dates.  Subjective: Comfortable with epidural  Objective: Filed Vitals:   07/11/15 0558 07/11/15 0600 07/11/15 0630 07/11/15 0700  BP: 123/69 123/69 135/74 113/61  Pulse: 86 86 75 70  Temp:      TempSrc:      Resp:      Height:      Weight:      SpO2:          FHT:  FHR: 135 bpm, variability: moderate,  accelerations:  Present,  decelerations:  Absent UC:   irregular, every 2-4 minutes SVE:   Dilation: 9 Effacement (%): 90 Station: -1 Exam by:: V Rogers RN  Foley fell out around 0400, and pt has progressed without pitocin  Labs: Lab Results  Component Value Date   WBC 12.8* 07/10/2015   HGB 12.3 07/10/2015   HCT 36.7 07/10/2015   MCV 88.0 07/10/2015   PLT 291 07/10/2015    Assessment / Plan: IOL for postdates, progressing well.   Labor: Progressing normally Fetal Wellbeing:  Category I Pain Control:  Epidural Anticipated MOD:  NSVD  CRESENZO-DISHMAN,Tephanie Escorcia 07/11/2015, 7:11 AM

## 2015-07-11 NOTE — Anesthesia Procedure Notes (Signed)
Epidural Patient location during procedure: OB  Staffing Anesthesiologist: Tymia Streb EDWARD Performed by: anesthesiologist   Preanesthetic Checklist Completed: patient identified, pre-op evaluation, timeout performed, IV checked, risks and benefits discussed and monitors and equipment checked  Epidural Patient position: sitting Prep: DuraPrep Patient monitoring: blood pressure and continuous pulse ox Approach: midline Location: L3-L4 Injection technique: LOR air  Needle:  Needle type: Tuohy  Needle gauge: 17 G Needle length: 9 cm Needle insertion depth: 5 cm Catheter size: 19 Gauge Catheter at skin depth: 10 cm Test dose: negative and Other (1% Lidocaine)  Additional Notes Patient identified.  Risk benefits discussed including failed block, incomplete pain control, headache, nerve damage, paralysis, blood pressure changes, nausea, vomiting, reactions to medication both toxic or allergic, and postpartum back pain.  Patient expressed understanding and wished to proceed.  All questions were answered.  Sterile technique used throughout procedure and epidural site dressed with sterile barrier dressing. No paresthesia or other complications noted. The patient did not experience any signs of intravascular injection such as tinnitus or metallic taste in mouth nor signs of intrathecal spread such as rapid motor block. Please see nursing notes for vital signs. Reason for block:procedure for pain   

## 2015-07-11 NOTE — Anesthesia Preprocedure Evaluation (Signed)
Anesthesia Evaluation  Patient identified by MRN, date of birth, ID band Patient awake    Reviewed: Allergy & Precautions, NPO status , Patient's Chart, lab work & pertinent test results  Airway Mallampati: II  TM Distance: >3 FB Neck ROM: Full    Dental  (+) Dental Advisory Given, Poor Dentition, Missing, Chipped   Pulmonary Current Smoker,    Pulmonary exam normal breath sounds clear to auscultation       Cardiovascular negative cardio ROS Normal cardiovascular exam Rhythm:Regular Rate:Normal     Neuro/Psych negative neurological ROS  negative psych ROS   GI/Hepatic GERD  ,(+)     substance abuse (on Subutex)  ,   Endo/Other  negative endocrine ROS  Renal/GU negative Renal ROS     Musculoskeletal negative musculoskeletal ROS (+)   Abdominal   Peds  Hematology negative hematology ROS (+) Plt 291k   Anesthesia Other Findings Day of surgery medications reviewed with the patient.  Reproductive/Obstetrics (+) Pregnancy                             Anesthesia Physical Anesthesia Plan  ASA: II  Anesthesia Plan: Epidural   Post-op Pain Management:    Induction:   Airway Management Planned:   Additional Equipment:   Intra-op Plan:   Post-operative Plan:   Informed Consent: I have reviewed the patients History and Physical, chart, labs and discussed the procedure including the risks, benefits and alternatives for the proposed anesthesia with the patient or authorized representative who has indicated his/her understanding and acceptance.   Dental advisory given  Plan Discussed with:   Anesthesia Plan Comments: (Patient identified. Risks/Benefits/Options discussed with patient including but not limited to bleeding, infection, nerve damage, paralysis, failed block, incomplete pain control, headache, blood pressure changes, nausea, vomiting, reactions to medication both or allergic,  itching and postpartum back pain. Confirmed with bedside nurse the patient's most recent platelet count. Confirmed with patient that they are not currently taking any anticoagulation, have any bleeding history or any family history of bleeding disorders. Patient expressed understanding and wished to proceed. All questions were answered. )        Anesthesia Quick Evaluation

## 2015-07-12 LAB — CBC
HEMATOCRIT: 32 % — AB (ref 36.0–46.0)
HEMOGLOBIN: 10.5 g/dL — AB (ref 12.0–15.0)
MCH: 29 pg (ref 26.0–34.0)
MCHC: 32.8 g/dL (ref 30.0–36.0)
MCV: 88.4 fL (ref 78.0–100.0)
Platelets: 247 10*3/uL (ref 150–400)
RBC: 3.62 MIL/uL — AB (ref 3.87–5.11)
RDW: 16.6 % — ABNORMAL HIGH (ref 11.5–15.5)
WBC: 16.7 10*3/uL — AB (ref 4.0–10.5)

## 2015-07-12 MED ORDER — MEASLES, MUMPS & RUBELLA VAC ~~LOC~~ INJ
0.5000 mL | INJECTION | Freq: Once | SUBCUTANEOUS | Status: DC
  Filled 2015-07-12: qty 0.5

## 2015-07-12 NOTE — Progress Notes (Signed)
Post Partum Day 1 Subjective:  [name] [name] is a 35 y.o. [id number] 54w3ds/p SVD.  No acute events overnight.  Pt denies problems with ambulating, voiding or po intake.  She denies nausea or vomiting.  Pain is well controlled.  She has had flatus. She has not had bowel movement.  Lochia Small.  Plan for birth control is bilateral tubal ligation.  Method of Feeding: bottle  Objective: Blood pressure 113/71, pulse 72, temperature 97.8 F (36.6 C), temperature source Oral, resp. rate 16, height 5' 6"  (1.676 m), weight 185 lb (83.915 kg), last menstrual period 09/24/2014, SpO2 99 %, unknown if currently breastfeeding.  Physical Exam:  General: alert, cooperative and no distress Lochia:normal flow Chest: CTAB, normal WOB on room air Heart: RRR no m/r/g Abdomen: +BS, soft, nontender Uterine Fundus: firm, below level of umbilicus DVT Evaluation: No evidence of DVT seen on physical exam. Extremities: trace pedal edema   Recent Labs  07/10/15 1715  HGB 12.3  HCT 36.7    Assessment/Plan:  ASSESSMENT: [name] KIMBLEYis a 35y.o. [id number]/p SVD  Plan for discharge tomorrow, MMR ordered   LOS: 2 days   AsRonnie DossDO 07/12/2015, 7:01 AM

## 2015-07-12 NOTE — Anesthesia Postprocedure Evaluation (Signed)
Anesthesia Post Note  Patient: [name] [name]  Procedure(s) Performed: * No procedures listed *  Patient location during evaluation: Mother Baby Anesthesia Type: Epidural Level of consciousness: awake Pain management: satisfactory to patient Vital Signs Assessment: post-procedure vital signs reviewed and stable Respiratory status: spontaneous breathing Cardiovascular status: stable Anesthetic complications: no    Last Vitals:  Filed Vitals:   07/11/15 2219 07/12/15 0548  BP: 118/75 113/71  Pulse: 70 72  Temp: 36.7 C 36.6 C  Resp: 18 16    Last Pain:  Filed Vitals:   07/12/15 0858  PainSc: 2                  Trinidad Petron

## 2015-07-12 NOTE — Clinical Social Work Maternal (Signed)
CLINICAL SOCIAL WORK MATERNAL/CHILD NOTE  Patient Details  Name: [name] [name] MRN: [id number] Date of Birth: [birth year]  Date:  07/12/2015  Clinical Social Worker Initiating Note:  Deyona Soza E. Brigitte Pulse Date/ Time Initiated:  07/12/15/1100     Child's Name:  [name] [name]   Legal Guardian:   (Parents: [name] [name] and [name] [name])   Need for Interpreter:  None   Date of Referral:  07/12/15     Reason for Referral:  Current Substance Use/Substance Use During Pregnancy , Homelessness    Referral Source:  [name]   Address:  [address] [place],  [address]  Phone number:  [phone]   Household Members:  [name], Parents (MOB states she lives with her mother and 38 year old daughter [name] [name].)   Natural Supports (not living in the home):  Spouse/significant other (MOB is married.  Her husband/FOB is currently incarcerated for lack of Child Support payment.)   Professional Supports: Other (Comment) (MOB attends [name] for substance abuse treatment including group and Subutex therapy.)   Employment:  (MOB states plans to look for employment.)   Type of Work:     Education:      Museum/gallery curator Resources:  [name]   Other Resources:      Cultural/Religious Considerations Which May Impact Care: None stated.  MOB's facesheet notes religion as Psychologist, forensic.  Strengths:  Ability to meet basic needs , Compliance with medical plan , Understanding of illness, Home prepared for child , Pediatrician chosen  (Pediatric follow up will be with [name])   Risk Factors/Current Problems:  Substance Use  (MOB reports heroin use as recently as 2 months ago.)   Cognitive State:  Alert , Linear Thinking , Goal Oriented    Mood/Affect:  Interested , Calm , Apprehensive    CSW Assessment: CSW met with MOB in her first floor room/115 to offer support and complete assessment due to hx of homelessness and substance use.  MOB is currently prescribed Subutex.  MOB was  alone in her room when CSW arrived.  She was quiet, but pleasant and welcoming of CSW's visit.  She appeared somewhat apprehensive about talking with CSW, which appeared to CSW to be related to fear and or embarrassment.   MOB reports she and baby are doing well.  She was open about her substance use history and states she has been abusing pain medication for the past 8 years, which turned to heroin use 2 years ago.  She reports wanting to make a change and better her life for her children.  She states she enrolled in services at [name] approximately 2 months ago and has been clean since starting Subutex at that time.  She reports she has been attending group therapy with this agency for the past month and finds this treatment to be working for her.  CSW commends her on seeking treatment and maintaining sobriety for the past two months.  CSW provided encouragement for her to continue on this path.  CSW discussed NAS protocol and encouraged MOB to watch for signs and communicate any concerns with staff.  CSW provided MOB with checklist in order to document any signs.  CSW informed her of the potential need for pharmacological intervention, which would have to take place in the NICU if needed.  CSW also informed MOB of the non-pharmacological interventions she can provide to baby.  MOB was attentive and understanding.  CSW explained hospital drug screen policy due to substance use in  pregnancy.  CSW explained that baby's UDS is negative and umbilical cord tissue screen is pending.  CSW notes treatment as a strength for MOB, but is also concerned that treatment and sobriety is very recent.  CSW informed MOB of need to make a referral to Child Protective Services because of this and encouraged MOB not to view this as punitive or punishment, but rather as another way to ensure she stays on track in her recovery.  MOB stated understanding. MOB reports that she was homeless for a period of time, but that  she is now staying with her mother.  She reports that her husband is incarcerated for not paying child support, and states that "maybe this was a good thing" because it allowed her to go live with her mother and start to get "back on her feet."  She reports that she is unsure of when he will be released, but that he will not be allowed to live at her mother's home with them.  She states he will need to live at a halfway house or with his grandmother.  MOB reports that FOB also uses substances.   MOB explained to CSW that her daughter was removed from her physical custody approximately three years ago because of drug use in the home.  She reports she did not lose legal rights, but her daughter has lived with her mother ever since this time.  MOB is hopeful that she will be able to find her own place and plans to have her daughter move in with her once she has a stable place to live.   MOB reports having all necessary supplies for infant at home.  She reports that she plans to take baby to [name] for follow up, which is where her daughter [name] [name] goes. CSW provided education regarding signs and symptoms of perinatal mood disorders, to which MOB was attentive.  She reports no history of mental illness and no concerns after her first delivery.  CSW stressed the importance of talking with a medical professional should she have emotional/mental health concerns at any time.  MOB stated agreement.     CSW Plan/Description:  Patient/Family Education , Child Protective Service Report     [name] [name] 07/12/2015, 2:52 PM

## 2015-07-12 NOTE — Discharge Summary (Signed)
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 yo [id number] at 7252w3d was admitted to the hospital 07/10/2015 for induction of labor.  Indication for induction: Postdates.  Patient had an uncomplicated labor course as follows: Membrane Rupture Time/Date: 5:11 AM ,07/11/2015   Intrapartum Procedures: Episiotomy: None [1]                                         Lacerations:  Labial [10]  Patient had delivery of a Viable infant.  Information for the patient's newborn:  Creed [name], Boy [name] [[id number]][[id number]]  Delivery Method: Vag-Spont    07/11/2015  Details of delivery can be found in separate delivery note.  Patient had a routine postpartum course. Patient is discharged home 07/13/2015.   Physical exam  Filed Vitals:   07/11/15 2219 07/12/15 0548 07/12/15 1832 07/13/15 0537  BP: 118/75 113/71 117/67 109/65  Pulse: 70 72 81 61  Temp: 98 F (36.7 C) 97.8 F (36.6 C) 98.4 F (36.9 C) 97.4 F (36.3 C)  TempSrc: Oral Oral Oral Axillary  Resp: 18 16 16 18   Height:      Weight:      SpO2:  99% 99%    General: alert, cooperative and no distress Lochia: appropriate Uterine Fundus: firm and at level of umbilicus Incision: N/A DVT Evaluation: No evidence of DVT seen on physical exam. Negative Homan's sign. No cords  or calf tenderness. No significant calf/ankle edema. Labs: Lab Results  Component Value Date   WBC 16.7* 07/12/2015   HGB 10.5* 07/12/2015   HCT 32.0* 07/12/2015   MCV 88.4 07/12/2015   PLT 247 07/12/2015   CMP Latest Ref Rng 06/16/2015  Glucose 65 - 99 mg/dL 13(Y58(L)  BUN 7 - 25 mg/dL 5(L)  Creatinine 8.650.50 - 1.10 mg/dL 7.840.60  Sodium 696135 - 295146 mmol/L 137  Potassium 3.5 - 5.3 mmol/L 4.2  Chloride 98 - 110 mmol/L 107  CO2 20 - 31 mmol/L 21  Calcium 8.6 - 10.2 mg/dL 9.1  Total Protein 6.1 - 8.1 g/dL 6.0(L)  Total Bilirubin 0.2 - 1.2 mg/dL 0.3  Alkaline Phos 33 - 115 U/L 158(H)  AST 10 - 30 U/L 13  ALT 6 - 29 U/L 10    Discharge instruction: per After Visit Summary and "Baby  and Me Booklet".  After visit meds:    Medication List    TAKE these medications        acetaminophen 325 MG tablet  Commonly known as:  TYLENOL  Take 650 mg by mouth every 6 (six) hours as needed for moderate pain.     buprenorphine 8 MG Subl SL tablet  Commonly known as:  SUBUTEX  Place 8 mg under the tongue daily.     ibuprofen 600 MG tablet  Commonly known as:  ADVIL,MOTRIN  Take 1 tablet (600 mg total) by mouth every 6 (six) hours.     multivitamin-prenatal 27-0.8 MG Tabs tablet  Take 1 tablet by mouth daily at 12 noon.        Diet: routine diet  Activity: Advance as tolerated. Pelvic rest for 6 weeks.   Outpatient follow up:6 weeks for pp, 2 weeks for pre-op appt Follow up Appt:No future appointments. Follow up Visit:No Follow-up on file.  Postpartum contraception: Tubal Ligation (interval)  Newborn Data: Live born female  Birth Weight: 7 lb 9.2 oz (3436 g) APGAR: 9, 9  Mother evaluated by clinical social work.  Please see their detailed note in EMR.  CSW Plan/Description: Restaurant manager, fast food, Child Protective Service Report    Baby Feeding: Bottle Disposition:home with mother   07/13/2015 Delynn Flavin, DO   OB FELLOW DISCHARGE ATTESTATION  I have seen and  examined this patient and agree with above documentation in the resident's note. Encouraged hep c f/u  Silvano Bilis, MD 8:23 AM

## 2015-07-13 MED ORDER — IBUPROFEN 600 MG PO TABS: 600.0000 mg | ORAL_TABLET | Freq: Four times a day (QID) | ORAL | Status: AC

## 2015-07-15 ENCOUNTER — Other Ambulatory Visit: Payer: Self-pay

## 2015-07-15 DIAGNOSIS — B1920 Unspecified viral hepatitis C without hepatic coma: Secondary | ICD-10-CM

## 2015-07-16 NOTE — Progress Notes (Signed)
Post discharge chart review completed.  

## 2015-07-22 ENCOUNTER — Other Ambulatory Visit: Payer: Medicaid Other

## 2015-07-22 DIAGNOSIS — B182 Chronic viral hepatitis C: Secondary | ICD-10-CM

## 2015-07-22 LAB — PROTIME-INR
INR: 0.94 (ref <1.50)
Prothrombin Time: 12.7 seconds (ref 11.6–15.2)

## 2015-07-23 ENCOUNTER — Ambulatory Visit: Payer: Medicaid Other | Admitting: Obstetrics & Gynecology

## 2015-07-23 LAB — HEPATITIS B SURFACE ANTIBODY,QUALITATIVE

## 2015-07-23 LAB — IRON: IRON: 52 ug/dL (ref 40–190)

## 2015-07-23 LAB — HEPATITIS B CORE ANTIBODY, TOTAL: Hep B Core Total Ab: NONREACTIVE

## 2015-07-23 LAB — ANA: Anti Nuclear Antibody(ANA): NEGATIVE

## 2015-07-23 LAB — HEPATITIS B SURFACE ANTIGEN: Hepatitis B Surface Ag: NEGATIVE

## 2015-07-23 LAB — HEPATITIS A ANTIBODY, TOTAL: Hep A Total Ab: NONREACTIVE

## 2015-08-20 ENCOUNTER — Ambulatory Visit (INDEPENDENT_AMBULATORY_CARE_PROVIDER_SITE_OTHER): Payer: Medicaid Other | Admitting: Internal Medicine

## 2015-08-20 ENCOUNTER — Encounter: Payer: Self-pay | Admitting: Internal Medicine

## 2015-08-20 VITALS — BP 107/71 | HR 64 | Temp 98.1°F | Wt 174.4 lb

## 2015-08-20 DIAGNOSIS — F1910 Other psychoactive substance abuse, uncomplicated: Secondary | ICD-10-CM

## 2015-08-20 DIAGNOSIS — O98419 Viral hepatitis complicating pregnancy, unspecified trimester: Secondary | ICD-10-CM

## 2015-08-20 DIAGNOSIS — B1710 Acute hepatitis C without hepatic coma: Secondary | ICD-10-CM | POA: Diagnosis not present

## 2015-08-20 DIAGNOSIS — F1911 Other psychoactive substance abuse, in remission: Secondary | ICD-10-CM

## 2015-08-21 LAB — HCV RNA QUANT RFLX ULTRA OR GENOTYP: HCV Quantitative: <15 IU/mL (ref <15)

## 2015-08-22 ENCOUNTER — Encounter: Payer: Self-pay | Admitting: Advanced Practice Midwife

## 2015-08-22 ENCOUNTER — Ambulatory Visit (INDEPENDENT_AMBULATORY_CARE_PROVIDER_SITE_OTHER): Payer: Medicaid Other | Admitting: Advanced Practice Midwife

## 2015-08-22 VITALS — BP 107/63 | HR 69 | Wt 173.8 lb

## 2015-08-22 DIAGNOSIS — Z3042 Encounter for surveillance of injectable contraceptive: Secondary | ICD-10-CM

## 2015-08-22 DIAGNOSIS — O0993 Supervision of high risk pregnancy, unspecified, third trimester: Secondary | ICD-10-CM

## 2015-08-22 DIAGNOSIS — R768 Other specified abnormal immunological findings in serum: Secondary | ICD-10-CM

## 2015-08-22 DIAGNOSIS — F1911 Other psychoactive substance abuse, in remission: Secondary | ICD-10-CM | POA: Insufficient documentation

## 2015-08-22 DIAGNOSIS — Z3202 Encounter for pregnancy test, result negative: Secondary | ICD-10-CM | POA: Diagnosis present

## 2015-08-22 LAB — POCT URINALYSIS DIP (DEVICE)
BILIRUBIN URINE: NEGATIVE
Glucose, UA: NEGATIVE mg/dL
KETONES UR: NEGATIVE mg/dL
Nitrite: NEGATIVE
PH: 5.5 (ref 5.0–8.0)
Protein, ur: NEGATIVE mg/dL
Specific Gravity, Urine: 1.02 (ref 1.005–1.030)
Urobilinogen, UA: 0.2 mg/dL (ref 0.0–1.0)

## 2015-08-22 LAB — POCT PREGNANCY, URINE: PREG TEST UR: NEGATIVE

## 2015-08-22 MED ORDER — MEDROXYPROGESTERONE ACETATE 150 MG/ML IM SUSP
150.0000 mg | Freq: Once | INTRAMUSCULAR | Status: AC
  Administered 2015-08-22: 150 mg via INTRAMUSCULAR

## 2015-08-22 NOTE — Progress Notes (Signed)
[facility]      Reason for Consult: hepatitis C evaluation    Referring Physician: Dr. Penne LashLeggett, Dr. Jolayne Pantheronstant    Patient ID: [name] [name], female    DOB: 02-Nov-1980, 35 y.o.   MRN: [id number]  HPI:   She comes in for a new patient evaluation for hepatitis C.  She was tested for her prenatal work up. She does not remember being tested previously.  She has a history of drug use though reports now she is drug free and continues on buprenorphine.  Her viral load in February was on 409 IU/mL, with no previous positive (or negative test).  She had been using drugs in the past year.  She now is post partum.  Here with her mother, baby, 226 yo child.  No alcohol.   No complaints.  Previous record reviewed from PCP/OB.    Past Medical History  Diagnosis Date  . GERD (gastroesophageal reflux disease)   . Substance abuse     subutex    Prior to Admission medications   Medication Sig Start Date End Date Taking? Authorizing Provider  acetaminophen (TYLENOL) 325 MG tablet Take 650 mg by mouth every 6 (six) hours as needed for moderate pain. Reported on 08/22/2015   Yes Historical Provider, MD  buprenorphine (SUBUTEX) 8 MG SUBL SL tablet Place 8 mg under the tongue daily.   Yes Historical Provider, MD  ibuprofen (ADVIL,MOTRIN) 600 MG tablet Take 1 tablet (600 mg total) by mouth every 6 (six) hours. 07/13/15  Yes Ashly Hulen SkainsM Gottschalk, DO  Prenatal Vit-Fe Fumarate-FA (MULTIVITAMIN-PRENATAL) 27-0.8 MG TABS tablet Take 1 tablet by mouth daily at 12 noon.    Yes Historical Provider, MD    No Known Allergies  Social History  Substance Use Topics  . Smoking status: Current Every Day Smoker -- 1.00 packs/day for 13 years    Types: Cigarettes  . Smokeless tobacco: Never Used  . Alcohol Use: No    Family History  Problem Relation Age of Onset  . Cancer Mother   . Diabetes Mother   . Hyperlipidemia Mother   . Hypertension Father   . Hyperlipidemia Father     Review of  Systems  Constitutional: negative for fatigue and malaise Musculoskeletal: negative for myalgias and arthralgias All other systems reviewed and are negative   Constitutional: in no apparent distress and alert  Filed Vitals:   08/20/15 1552  BP: 107/71  Pulse: 64  Temp: 98.1 F (36.7 C)   EYES: anicteric ENMT: Cardiovascular: Cor RRR and No murmurs Respiratory: CTA B; normal respiratory effort GI: Bowel sounds are normal, liver is not enlarged, spleen is not enlarged Musculoskeletal: no pedal edema noted Skin: negatives: no rash Neuro: non focal  Labs: Lab Results  Component Value Date   WBC 16.7* 07/12/2015   HGB 10.5* 07/12/2015   HCT 32.0* 07/12/2015   MCV 88.4 07/12/2015   PLT 247 07/12/2015    Lab Results  Component Value Date   CREATININE 0.60 06/16/2015   BUN 5* 06/16/2015   NA 137 06/16/2015   K 4.2 06/16/2015   CL 107 06/16/2015   CO2 21 06/16/2015    Lab Results  Component Value Date   ALT 10 06/16/2015   AST 13 06/16/2015   ALKPHOS 158* 06/16/2015   BILITOT 0.3 06/16/2015   INR 0.94 07/22/2015     Assessment: Acute hepatitis C.  I explained to her the natural history of hepatitis C and how 20% of  people infected will clear the virus spontaneously in the first year (typically within 6 months).  Her low viral load of 409 - generally chronic infection shows a viral load in the 100,000s range - suggests she was infected within the last year and is clearing the infection spontaneously and therefore will not require treatment.   Also with substance abuse.  Plan: 1) virus rechecked at the visit and is negative concurring spontaneous clearing. 2) I will recheck her virus in 6 months to reconfirm.   3) I encouraged continued efforts at remaining clean.   Thank you for referral

## 2015-08-22 NOTE — Progress Notes (Signed)
Subjective:     [name] [name] is a 35 y.o. female who presents for a postpartum visit. She is 6 weeks postpartum following a spontaneous vaginal delivery. I have fully reviewed the prenatal and intrapartum course. The delivery was at 41.3 gestational weeks. Outcome: spontaneous vaginal delivery. Anesthesia: epidural. Postpartum course has been complicated by leaking of urine and feeling as if she has to run to the bathroom. Baby's course has been uncomlicated. Baby is feeding by bottle - Similac Advance. Bleeding red. Bowel function is normal. Bladder function is normal. Patient is not sexually active. Contraception method is Depo-Provera injections. Postpartum depression screening: negative.  The following portions of the patient's history were reviewed and updated as appropriate: allergies, current medications, past family history, past medical history, past social history, past surgical history and problem list.  Last Pap appears to have been done 05/2015, but cannot see a result in Epic. Pt thinks it was done. Will check w/ lab. Pt had planned BTL, but missed pre-op visit. Undecided btw BTL and Depo today.   New Dx Hep C. Started care w/ ID. No meds at this time.  Review of Systems Pertinent items are noted in HPI. Denies dysuria, flank pain, hematuria.   Objective:    BP 107/63 mmHg  Pulse 69  Wt 173 lb 12.8 oz (78.835 kg)  LMP 08/18/2015 (Approximate)  Breastfeeding? No  General:  alert, cooperative, appears older than stated age, no distress and mildly obese   Breasts:  Declined  Lungs: clear to auscultation bilaterally  Heart:  regular rate and rhythm, S1, S2 normal, no murmur, click, rub or gallop  Abdomen: soft, non-tender; bowel sounds normal; no masses,  no organomegaly and Fundus non-palpable   Vulva:  normal and well-healed  Vagina: normal vagina and Positive pelvic floor tone.   Cervix:  no cervical motion tenderness  Corpus: normal size, contour, position, consistency,  mobility, non-tender  Adnexa:  normal adnexa and no mass, fullness, tenderness  Rectal Exam: Not performed.        Assessment:     Normal postpartum exam. Pap smear will look up results.   Plan:    1. Contraception: Depo-Provera injections 2. BTL info given. Call ASAP if you decide to schedule it.  3. Follow up in: 1 year or as needed.

## 2015-08-22 NOTE — Patient Instructions (Addendum)
Sterilization Information, Female Female sterilization is a procedure to permanently prevent pregnancy. There are different ways to perform sterilization, but all either block or close the fallopian tubes so that your eggs cannot reach your uterus. If your egg cannot reach your uterus, sperm cannot fertilize the egg, and you cannot get pregnant.  Sterilization is performed by a surgical procedure. Sometimes these procedures are performed in a hospital while a patient is asleep. Sometimes they can be done in a clinic setting with the patient awake. The fallopian tubes can be surgically cut, tied, or sealed through a procedure called tubal ligation. The fallopian tubes can also be closed with clips or rings. Sterilization can also be done by placing a tiny coil into each fallopian tube, which causes scar tissue to grow inside the tube. The scar tissue then blocks the tubes.  Discuss sterilization with your caregiver to answer any concerns you or your partner may have. You may want to ask what type of sterilization your caregiver performs. Some caregivers may not perform all the various options. Sterilization is permanent and should only be done if you are sure you do not want children or do not want any more children. Having a sterilization reversed may not be successful.  STERILIZATION PROCEDURES 1. Laparoscopic sterilization. This is a surgical method performed at a time other than right after childbirth. Two incisions are made in the lower abdomen. A thin, lighted tube (laparoscope) is inserted into one of the incisions and is used to perform the procedure. The fallopian tubes are closed with a ring or a clip. An instrument that uses heat could be used to seal the tubes closed (electrocautery).  2. Mini-laparotomy. This is a surgical method done 1 or 2 days after giving birth. Typically, a small incision is made just below the belly button (umbilicus) and the fallopian tubes are exposed. The tubes can then  be sealed, tied, or cut.  3. Hysteroscopic sterilization. This is performed at a time other than right after childbirth. A tiny, spring-like coil is inserted through the cervix and uterus and placed into the fallopian tubes. The coil causes scaring and blocks the tubes. Other forms of contraception should be used for 3 months after the procedure to allow the scar tissue to form completely. Additionally, it is required hysterosalpingography be done 3 months later to ensure that the procedure was successful. Hysterosalpingography is a procedure that uses X-rays to look at your uterus and fallopian tubes after a material to make them show up better has been inserted. IS STERILIZATION SAFE? Sterilization is considered safe with very rare complications. Risks depend on the type of procedure you have. As with any surgical procedure, there are risks. Some risks of sterilization by any means include:   Bleeding.  Infection.  Reaction to anesthesia medicine.  Injury to surrounding organs. Risks specific to having hysteroscopic coils placed include:  The coils may not be placed correctly the first time.   The coils may move out of place.   The tubes may not get completely blocked after 3 months.   Injury to surrounding organs when placing the coil.  HOW EFFECTIVE IS FEMALE STERILIZATION? Sterilization is nearly 100% effective, but it can fail. Depending on the type of sterilization, the rate of failure can be as high as 3%. After hysteroscopic sterilization with placement of fallopian tube coils, you will need back-up birth control for 3 months after the procedure. Sterilization is effective for a lifetime.  BENEFITS OF STERILIZATION  It does  not affect your hormones, and therefore will not affect your menstrual periods, sexual desire, or performance.   It is effective for a lifetime.   It is safe.   You do not need to worry about getting pregnant. Keep in mind that if you had the  hysteroscopic placement procedure, you must wait 3 months after the procedure (or until your caregiver confirms) before pregnancy is not considered possible.   There are no side effects unlike other types of birth control (contraception).  DRAWBACKS OF STERILIZATION  You must be sure you do not want children or any more children. The procedure is permanent.   It does not provide protection against sexually transmitted infections (STIs).   The tubes can grow back together. If this happens, there is a risk of pregnancy. There is also an increased risk (50%) of pregnancy being an ectopic pregnancy. This is a pregnancy that happens outside of the uterus.   This information is not intended to replace advice given to you by your health care provider. Make sure you discuss any questions you have with your health care provider.   Document Released: 09/08/2007 Document Revised: 03/27/2013 Document Reviewed: 07/08/2011 Elsevier Interactive Patient Education 2016 [employer].   Kegel Exercises The goal of Kegel exercises is to isolate and exercise your pelvic floor muscles. These muscles act as a hammock that supports the rectum, vagina, small intestine, and uterus. As the muscles weaken, the hammock sags and these organs are displaced from their normal positions. Kegel exercises can strengthen your pelvic floor muscles and help you to improve bladder and bowel control, improve sexual response, and help reduce many problems and some discomfort during pregnancy. Kegel exercises can be done anywhere and at any time. HOW TO PERFORM KEGEL EXERCISES 4. Locate your pelvic floor muscles. To do this, squeeze (contract) the muscles that you use when you try to stop the flow of urine. You will feel a tightness in the vaginal area (women) and a tight lift in the rectal area (men and women). 5. When you begin, contract your pelvic muscles tight for 2-5 seconds, then relax them for 2-5 seconds. This is one set.  Do 4-5 sets with a short pause in between. 6. Contract your pelvic muscles for 8-10 seconds, then relax them for 8-10 seconds. Do 4-5 sets. If you cannot contract your pelvic muscles for 8-10 seconds, try 5-7 seconds and work your way up to 8-10 seconds. Your goal is 4-5 sets of 10 contractions each day. Keep your stomach, buttocks, and legs relaxed during the exercises. Perform sets of both short and long contractions. Vary your positions. Perform these contractions 3-4 times per day. Perform sets while you are:   Lying in bed in the morning.  Standing at lunch.  Sitting in the late afternoon.  Lying in bed at night. You should do 40-50 contractions per day. Do not perform more Kegel exercises per day than recommended. Overexercising can cause muscle fatigue. Continue these exercises for for at least 15-20 weeks or as directed by your caregiver.   This information is not intended to replace advice given to you by your health care provider. Make sure you discuss any questions you have with your health care provider.   Document Released: 03/08/2012 Document Revised: 04/12/2014 Document Reviewed: 03/08/2012 Elsevier Interactive Patient Education [employer]

## 2015-08-22 NOTE — Addendum Note (Signed)
Addended by: Faythe CasaBELLAMY, Wyndi Northrup M on: 08/22/2015 10:08 AM   Modules accepted: Orders

## 2015-08-25 ENCOUNTER — Telehealth: Payer: Self-pay | Admitting: *Deleted

## 2015-08-25 NOTE — Telephone Encounter (Signed)
-----   Message from Gardiner Barefootobert W Comer, MD sent at 08/22/2015  8:48 AM EDT ----- Please let her know that her HCV virus is completely gone (acute infection, resolved spontaneously) and no need for treatment.  I still would like to check it again in 6 months but looks like it resolved as long as she does not get reinfected.   thanks

## 2015-08-25 NOTE — Telephone Encounter (Signed)
Left message letting patient know her infection has resolved, does not require treatment at this time, but that Dr. Luciana Axeomer would like to see her in 6 months.  Andree CossHowell, Rayona Sardinha M, RN

## 2015-09-12 ENCOUNTER — Ambulatory Visit: Payer: Medicaid Other | Admitting: Internal Medicine

## 2015-12-05 ENCOUNTER — Ambulatory Visit (INDEPENDENT_AMBULATORY_CARE_PROVIDER_SITE_OTHER): Payer: Medicaid Other | Admitting: General Practice

## 2015-12-05 VITALS — BP 115/63 | HR 76 | Ht 66.0 in | Wt 190.0 lb

## 2015-12-05 DIAGNOSIS — Z3042 Encounter for surveillance of injectable contraceptive: Secondary | ICD-10-CM | POA: Diagnosis not present

## 2015-12-05 DIAGNOSIS — Z3202 Encounter for pregnancy test, result negative: Secondary | ICD-10-CM

## 2015-12-05 DIAGNOSIS — Z30013 Encounter for initial prescription of injectable contraceptive: Secondary | ICD-10-CM | POA: Diagnosis not present

## 2015-12-05 LAB — POCT PREGNANCY, URINE: Preg Test, Ur: NEGATIVE

## 2015-12-05 MED ORDER — MEDROXYPROGESTERONE ACETATE 150 MG/ML IM SUSP
150.0000 mg | Freq: Once | INTRAMUSCULAR | Status: AC
  Administered 2015-12-05: 150 mg via INTRAMUSCULAR

## 2015-12-05 NOTE — Progress Notes (Signed)
Patient here for depo shot today. Last day due was 8/18. Patient denies unprotected sex in the last two weeks & is on her period today. Negative upt obtained. Discussed using back up protection for the next two weeks & she may also make an appt to see a provider to discuss long term options if she would like. Patient verbalized understanding & had no questions

## 2016-02-20 ENCOUNTER — Ambulatory Visit: Payer: Medicaid Other

## 2016-02-23 ENCOUNTER — Ambulatory Visit: Payer: Medicaid Other | Admitting: Internal Medicine

## 2016-04-07 ENCOUNTER — Ambulatory Visit: Payer: Medicaid Other | Admitting: Internal Medicine

## 2021-07-26 ENCOUNTER — Emergency Department (HOSPITAL_COMMUNITY)
Admission: EM | Admit: 2021-07-26 | Discharge: 2021-07-27 | Discharge status: Against Medical Advice | Payer: Managed Care, Other (non HMO) | Attending: Emergency Medicine | Admitting: Emergency Medicine

## 2021-07-26 ENCOUNTER — Other Ambulatory Visit: Payer: Self-pay

## 2021-07-26 DIAGNOSIS — Z5321 Procedure and treatment not carried out due to patient leaving prior to being seen by health care provider: Secondary | ICD-10-CM | POA: Insufficient documentation

## 2021-07-26 DIAGNOSIS — R519 Headache, unspecified: Secondary | ICD-10-CM | POA: Insufficient documentation

## 2021-07-26 NOTE — ED Triage Notes (Signed)
Pt presents with what she describes as a "sinus" headache that is not responding to tylenol or ibuprofen  ?

## 2021-07-27 NOTE — ED Notes (Signed)
Called 3x to be roomed with no response. Pt not seen in lobby at this time.  ?

## 2022-09-16 ENCOUNTER — Emergency Department (HOSPITAL_COMMUNITY)
Admission: EM | Admit: 2022-09-16 | Discharge: 2022-09-16 | Disposition: A | Discharge status: Home / Self Care | Payer: Medicaid Other | Attending: Emergency Medicine | Admitting: Emergency Medicine

## 2022-09-16 ENCOUNTER — Encounter (HOSPITAL_COMMUNITY): Payer: Self-pay

## 2022-09-16 ENCOUNTER — Other Ambulatory Visit: Payer: Self-pay

## 2022-09-16 ENCOUNTER — Emergency Department (HOSPITAL_COMMUNITY): Payer: Medicaid Other

## 2022-09-16 DIAGNOSIS — S0083XA Contusion of other part of head, initial encounter: Secondary | ICD-10-CM | POA: Diagnosis not present

## 2022-09-16 DIAGNOSIS — S0990XA Unspecified injury of head, initial encounter: Secondary | ICD-10-CM | POA: Diagnosis present

## 2022-09-16 LAB — I-STAT CHEM 8, ED
BUN: 7 mg/dL (ref 6–20)
Calcium, Ion: 1.18 mmol/L (ref 1.15–1.40)
Chloride: 102 mmol/L (ref 98–111)
Creatinine, Ser: 0.7 mg/dL (ref 0.44–1.00)
Glucose, Bld: 96 mg/dL (ref 70–99)
HCT: 43 % (ref 36.0–46.0)
Hemoglobin: 14.6 g/dL (ref 12.0–15.0)
Potassium: 4 mmol/L (ref 3.5–5.1)
Sodium: 139 mmol/L (ref 135–145)
TCO2: 29 mmol/L (ref 22–32)

## 2022-09-16 LAB — I-STAT BETA HCG BLOOD, ED (MC, WL, AP ONLY): I-stat hCG, quantitative: <5 m[IU]/mL (ref <5)

## 2022-09-16 NOTE — ED Notes (Signed)
Patient verbalizes understanding of discharge instructions. Opportunity for questioning and answers were provided. Armband removed by staff, pt discharged from ED. Pt ambulatory to ED waiting room with steady gait.  
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
